# Patient Record
Sex: Female | Born: 1950
Health system: Southern US, Community
[De-identification: ages and names within clinical notes are randomized; demographics above are authoritative.]

## PROBLEM LIST (undated history)

## (undated) DIAGNOSIS — M1990 Unspecified osteoarthritis, unspecified site: Secondary | ICD-10-CM

## (undated) DIAGNOSIS — K589 Irritable bowel syndrome without diarrhea: Secondary | ICD-10-CM

## (undated) DIAGNOSIS — N3281 Overactive bladder: Secondary | ICD-10-CM

## (undated) DIAGNOSIS — Z8719 Personal history of other diseases of the digestive system: Secondary | ICD-10-CM

## (undated) DIAGNOSIS — F32A Depression, unspecified: Secondary | ICD-10-CM

## (undated) DIAGNOSIS — J45909 Unspecified asthma, uncomplicated: Secondary | ICD-10-CM

## (undated) DIAGNOSIS — E7800 Pure hypercholesterolemia, unspecified: Secondary | ICD-10-CM

## (undated) DIAGNOSIS — K219 Gastro-esophageal reflux disease without esophagitis: Secondary | ICD-10-CM

## (undated) HISTORY — PX: NASAL SINUS SURGERY: SHX719

## (undated) HISTORY — DX: Pure hypercholesterolemia, unspecified: E78.00

## (undated) HISTORY — DX: Gastro-esophageal reflux disease without esophagitis: K21.9

## (undated) HISTORY — PX: TONSILLECTOMY: SUR1361

## (undated) HISTORY — PX: APPENDECTOMY: SHX54

## (undated) HISTORY — DX: Overactive bladder: N32.81

## (undated) HISTORY — DX: Personal history of other diseases of the digestive system: Z87.19

## (undated) HISTORY — DX: Unspecified osteoarthritis, unspecified site: M19.90

## (undated) HISTORY — PX: CHOLECYSTECTOMY: SHX55

## (undated) HISTORY — DX: Irritable bowel syndrome, unspecified: K58.9

## (undated) HISTORY — PX: VAGINAL HYSTERECTOMY: SUR661

## (undated) HISTORY — DX: Depression, unspecified: F32.A

## (undated) HISTORY — DX: Unspecified asthma, uncomplicated: J45.909

---

## 1997-12-07 ENCOUNTER — Other Ambulatory Visit: Admission: RE | Admit: 1997-12-07 | Discharge: 1997-12-07 | Payer: Self-pay | Admitting: Obstetrics and Gynecology

## 1998-07-11 ENCOUNTER — Inpatient Hospital Stay (HOSPITAL_COMMUNITY): Admission: EM | Admit: 1998-07-11 | Discharge: 1998-07-12 | Payer: Self-pay | Admitting: *Deleted

## 1998-09-14 ENCOUNTER — Other Ambulatory Visit: Admission: RE | Admit: 1998-09-14 | Discharge: 1998-09-14 | Payer: Self-pay | Admitting: Gynecology

## 2011-06-23 HISTORY — PX: COLONOSCOPY: SHX174

## 2012-05-15 HISTORY — PX: BLADDER SURGERY: SHX569

## 2015-05-26 DIAGNOSIS — T148 Other injury of unspecified body region: Secondary | ICD-10-CM | POA: Diagnosis not present

## 2015-05-26 DIAGNOSIS — M2550 Pain in unspecified joint: Secondary | ICD-10-CM | POA: Diagnosis not present

## 2015-05-26 DIAGNOSIS — M79641 Pain in right hand: Secondary | ICD-10-CM | POA: Diagnosis not present

## 2015-07-26 DIAGNOSIS — E785 Hyperlipidemia, unspecified: Secondary | ICD-10-CM | POA: Diagnosis not present

## 2015-07-26 DIAGNOSIS — J45909 Unspecified asthma, uncomplicated: Secondary | ICD-10-CM | POA: Diagnosis not present

## 2015-07-26 DIAGNOSIS — J0110 Acute frontal sinusitis, unspecified: Secondary | ICD-10-CM | POA: Diagnosis not present

## 2015-07-26 DIAGNOSIS — R7301 Impaired fasting glucose: Secondary | ICD-10-CM | POA: Diagnosis not present

## 2015-09-21 DIAGNOSIS — R350 Frequency of micturition: Secondary | ICD-10-CM | POA: Diagnosis not present

## 2015-09-21 DIAGNOSIS — Z6824 Body mass index (BMI) 24.0-24.9, adult: Secondary | ICD-10-CM | POA: Diagnosis not present

## 2015-09-21 DIAGNOSIS — N390 Urinary tract infection, site not specified: Secondary | ICD-10-CM | POA: Diagnosis not present

## 2015-12-07 DIAGNOSIS — F339 Major depressive disorder, recurrent, unspecified: Secondary | ICD-10-CM | POA: Diagnosis not present

## 2015-12-07 DIAGNOSIS — Z7189 Other specified counseling: Secondary | ICD-10-CM | POA: Diagnosis not present

## 2015-12-07 DIAGNOSIS — E785 Hyperlipidemia, unspecified: Secondary | ICD-10-CM | POA: Diagnosis not present

## 2015-12-07 DIAGNOSIS — R7303 Prediabetes: Secondary | ICD-10-CM | POA: Diagnosis not present

## 2015-12-07 DIAGNOSIS — Z6824 Body mass index (BMI) 24.0-24.9, adult: Secondary | ICD-10-CM | POA: Diagnosis not present

## 2016-01-25 DIAGNOSIS — J0100 Acute maxillary sinusitis, unspecified: Secondary | ICD-10-CM | POA: Diagnosis not present

## 2016-01-25 DIAGNOSIS — Z6825 Body mass index (BMI) 25.0-25.9, adult: Secondary | ICD-10-CM | POA: Diagnosis not present

## 2016-01-25 DIAGNOSIS — J45909 Unspecified asthma, uncomplicated: Secondary | ICD-10-CM | POA: Diagnosis not present

## 2016-01-25 DIAGNOSIS — J0110 Acute frontal sinusitis, unspecified: Secondary | ICD-10-CM | POA: Diagnosis not present

## 2016-04-05 DIAGNOSIS — R7303 Prediabetes: Secondary | ICD-10-CM | POA: Diagnosis not present

## 2016-04-05 DIAGNOSIS — E785 Hyperlipidemia, unspecified: Secondary | ICD-10-CM | POA: Diagnosis not present

## 2016-04-11 DIAGNOSIS — K219 Gastro-esophageal reflux disease without esophagitis: Secondary | ICD-10-CM | POA: Diagnosis not present

## 2016-04-11 DIAGNOSIS — F339 Major depressive disorder, recurrent, unspecified: Secondary | ICD-10-CM | POA: Diagnosis not present

## 2016-04-11 DIAGNOSIS — E785 Hyperlipidemia, unspecified: Secondary | ICD-10-CM | POA: Diagnosis not present

## 2016-04-11 DIAGNOSIS — R1314 Dysphagia, pharyngoesophageal phase: Secondary | ICD-10-CM | POA: Diagnosis not present

## 2016-05-02 DIAGNOSIS — Z1231 Encounter for screening mammogram for malignant neoplasm of breast: Secondary | ICD-10-CM | POA: Diagnosis not present

## 2016-05-26 DIAGNOSIS — Z139 Encounter for screening, unspecified: Secondary | ICD-10-CM | POA: Diagnosis not present

## 2016-05-26 DIAGNOSIS — Z1389 Encounter for screening for other disorder: Secondary | ICD-10-CM | POA: Diagnosis not present

## 2016-05-26 DIAGNOSIS — Z0000 Encounter for general adult medical examination without abnormal findings: Secondary | ICD-10-CM | POA: Diagnosis not present

## 2016-05-26 DIAGNOSIS — F339 Major depressive disorder, recurrent, unspecified: Secondary | ICD-10-CM | POA: Diagnosis not present

## 2016-06-09 DIAGNOSIS — Z6824 Body mass index (BMI) 24.0-24.9, adult: Secondary | ICD-10-CM | POA: Diagnosis not present

## 2016-06-09 DIAGNOSIS — J101 Influenza due to other identified influenza virus with other respiratory manifestations: Secondary | ICD-10-CM | POA: Diagnosis not present

## 2016-08-09 DIAGNOSIS — R7303 Prediabetes: Secondary | ICD-10-CM | POA: Diagnosis not present

## 2016-08-09 DIAGNOSIS — E785 Hyperlipidemia, unspecified: Secondary | ICD-10-CM | POA: Diagnosis not present

## 2016-08-21 DIAGNOSIS — E785 Hyperlipidemia, unspecified: Secondary | ICD-10-CM | POA: Diagnosis not present

## 2016-08-21 DIAGNOSIS — Z6824 Body mass index (BMI) 24.0-24.9, adult: Secondary | ICD-10-CM | POA: Diagnosis not present

## 2016-08-21 DIAGNOSIS — K921 Melena: Secondary | ICD-10-CM | POA: Diagnosis not present

## 2016-08-21 DIAGNOSIS — R7303 Prediabetes: Secondary | ICD-10-CM | POA: Diagnosis not present

## 2016-08-24 DIAGNOSIS — R1032 Left lower quadrant pain: Secondary | ICD-10-CM | POA: Diagnosis not present

## 2016-08-24 DIAGNOSIS — K625 Hemorrhage of anus and rectum: Secondary | ICD-10-CM | POA: Diagnosis not present

## 2016-08-24 DIAGNOSIS — Z1211 Encounter for screening for malignant neoplasm of colon: Secondary | ICD-10-CM | POA: Diagnosis not present

## 2016-11-13 DIAGNOSIS — R7303 Prediabetes: Secondary | ICD-10-CM | POA: Diagnosis not present

## 2016-11-13 DIAGNOSIS — E785 Hyperlipidemia, unspecified: Secondary | ICD-10-CM | POA: Diagnosis not present

## 2016-11-20 DIAGNOSIS — J302 Other seasonal allergic rhinitis: Secondary | ICD-10-CM | POA: Diagnosis not present

## 2016-11-20 DIAGNOSIS — J305 Allergic rhinitis due to food: Secondary | ICD-10-CM | POA: Diagnosis not present

## 2016-11-20 DIAGNOSIS — F339 Major depressive disorder, recurrent, unspecified: Secondary | ICD-10-CM | POA: Diagnosis not present

## 2016-11-20 DIAGNOSIS — E785 Hyperlipidemia, unspecified: Secondary | ICD-10-CM | POA: Diagnosis not present

## 2016-11-20 DIAGNOSIS — J45909 Unspecified asthma, uncomplicated: Secondary | ICD-10-CM | POA: Diagnosis not present

## 2016-11-21 DIAGNOSIS — J305 Allergic rhinitis due to food: Secondary | ICD-10-CM | POA: Diagnosis not present

## 2016-11-21 DIAGNOSIS — J302 Other seasonal allergic rhinitis: Secondary | ICD-10-CM | POA: Diagnosis not present

## 2016-11-22 DIAGNOSIS — J302 Other seasonal allergic rhinitis: Secondary | ICD-10-CM | POA: Diagnosis not present

## 2016-11-22 DIAGNOSIS — J305 Allergic rhinitis due to food: Secondary | ICD-10-CM | POA: Diagnosis not present

## 2016-11-23 DIAGNOSIS — J302 Other seasonal allergic rhinitis: Secondary | ICD-10-CM | POA: Diagnosis not present

## 2016-11-23 DIAGNOSIS — J305 Allergic rhinitis due to food: Secondary | ICD-10-CM | POA: Diagnosis not present

## 2016-11-24 DIAGNOSIS — J305 Allergic rhinitis due to food: Secondary | ICD-10-CM | POA: Diagnosis not present

## 2016-11-24 DIAGNOSIS — J302 Other seasonal allergic rhinitis: Secondary | ICD-10-CM | POA: Diagnosis not present

## 2016-11-27 DIAGNOSIS — J305 Allergic rhinitis due to food: Secondary | ICD-10-CM | POA: Diagnosis not present

## 2016-11-27 DIAGNOSIS — J302 Other seasonal allergic rhinitis: Secondary | ICD-10-CM | POA: Diagnosis not present

## 2016-11-28 DIAGNOSIS — J302 Other seasonal allergic rhinitis: Secondary | ICD-10-CM | POA: Diagnosis not present

## 2016-11-28 DIAGNOSIS — J305 Allergic rhinitis due to food: Secondary | ICD-10-CM | POA: Diagnosis not present

## 2016-11-29 DIAGNOSIS — J305 Allergic rhinitis due to food: Secondary | ICD-10-CM | POA: Diagnosis not present

## 2016-11-29 DIAGNOSIS — J302 Other seasonal allergic rhinitis: Secondary | ICD-10-CM | POA: Diagnosis not present

## 2016-12-06 DIAGNOSIS — Z6823 Body mass index (BMI) 23.0-23.9, adult: Secondary | ICD-10-CM | POA: Diagnosis not present

## 2016-12-06 DIAGNOSIS — M79609 Pain in unspecified limb: Secondary | ICD-10-CM | POA: Diagnosis not present

## 2016-12-06 DIAGNOSIS — Z139 Encounter for screening, unspecified: Secondary | ICD-10-CM | POA: Diagnosis not present

## 2016-12-06 DIAGNOSIS — I10 Essential (primary) hypertension: Secondary | ICD-10-CM | POA: Diagnosis not present

## 2016-12-06 DIAGNOSIS — J45909 Unspecified asthma, uncomplicated: Secondary | ICD-10-CM | POA: Diagnosis not present

## 2017-01-02 DIAGNOSIS — H1045 Other chronic allergic conjunctivitis: Secondary | ICD-10-CM | POA: Diagnosis not present

## 2017-01-02 DIAGNOSIS — J302 Other seasonal allergic rhinitis: Secondary | ICD-10-CM | POA: Diagnosis not present

## 2017-01-02 DIAGNOSIS — S0501XA Injury of conjunctiva and corneal abrasion without foreign body, right eye, initial encounter: Secondary | ICD-10-CM | POA: Diagnosis not present

## 2017-02-12 DIAGNOSIS — Z23 Encounter for immunization: Secondary | ICD-10-CM | POA: Diagnosis not present

## 2017-02-21 DIAGNOSIS — Z6823 Body mass index (BMI) 23.0-23.9, adult: Secondary | ICD-10-CM | POA: Diagnosis not present

## 2017-02-21 DIAGNOSIS — J309 Allergic rhinitis, unspecified: Secondary | ICD-10-CM | POA: Diagnosis not present

## 2017-03-07 DIAGNOSIS — J3089 Other allergic rhinitis: Secondary | ICD-10-CM | POA: Diagnosis not present

## 2017-03-14 DIAGNOSIS — J3089 Other allergic rhinitis: Secondary | ICD-10-CM | POA: Diagnosis not present

## 2017-03-22 DIAGNOSIS — J3089 Other allergic rhinitis: Secondary | ICD-10-CM | POA: Diagnosis not present

## 2017-03-22 DIAGNOSIS — R7303 Prediabetes: Secondary | ICD-10-CM | POA: Diagnosis not present

## 2017-03-22 DIAGNOSIS — E785 Hyperlipidemia, unspecified: Secondary | ICD-10-CM | POA: Diagnosis not present

## 2017-03-28 DIAGNOSIS — J3089 Other allergic rhinitis: Secondary | ICD-10-CM | POA: Diagnosis not present

## 2017-03-29 DIAGNOSIS — R7303 Prediabetes: Secondary | ICD-10-CM | POA: Diagnosis not present

## 2017-03-29 DIAGNOSIS — Z6823 Body mass index (BMI) 23.0-23.9, adult: Secondary | ICD-10-CM | POA: Diagnosis not present

## 2017-03-29 DIAGNOSIS — Z139 Encounter for screening, unspecified: Secondary | ICD-10-CM | POA: Diagnosis not present

## 2017-03-29 DIAGNOSIS — E785 Hyperlipidemia, unspecified: Secondary | ICD-10-CM | POA: Diagnosis not present

## 2017-04-03 DIAGNOSIS — J3089 Other allergic rhinitis: Secondary | ICD-10-CM | POA: Diagnosis not present

## 2017-04-10 DIAGNOSIS — J3089 Other allergic rhinitis: Secondary | ICD-10-CM | POA: Diagnosis not present

## 2017-04-17 DIAGNOSIS — J3089 Other allergic rhinitis: Secondary | ICD-10-CM | POA: Diagnosis not present

## 2017-04-24 DIAGNOSIS — J3089 Other allergic rhinitis: Secondary | ICD-10-CM | POA: Diagnosis not present

## 2017-05-01 DIAGNOSIS — J3089 Other allergic rhinitis: Secondary | ICD-10-CM | POA: Diagnosis not present

## 2017-05-10 DIAGNOSIS — J3089 Other allergic rhinitis: Secondary | ICD-10-CM | POA: Diagnosis not present

## 2017-05-16 DIAGNOSIS — J3089 Other allergic rhinitis: Secondary | ICD-10-CM | POA: Diagnosis not present

## 2017-05-21 DIAGNOSIS — R05 Cough: Secondary | ICD-10-CM | POA: Diagnosis not present

## 2017-05-21 DIAGNOSIS — Z6823 Body mass index (BMI) 23.0-23.9, adult: Secondary | ICD-10-CM | POA: Diagnosis not present

## 2017-05-21 DIAGNOSIS — J0100 Acute maxillary sinusitis, unspecified: Secondary | ICD-10-CM | POA: Diagnosis not present

## 2017-05-21 DIAGNOSIS — J45909 Unspecified asthma, uncomplicated: Secondary | ICD-10-CM | POA: Diagnosis not present

## 2017-05-23 DIAGNOSIS — J3089 Other allergic rhinitis: Secondary | ICD-10-CM | POA: Diagnosis not present

## 2017-05-29 DIAGNOSIS — J3089 Other allergic rhinitis: Secondary | ICD-10-CM | POA: Diagnosis not present

## 2017-06-05 DIAGNOSIS — J3089 Other allergic rhinitis: Secondary | ICD-10-CM | POA: Diagnosis not present

## 2017-06-13 DIAGNOSIS — J3089 Other allergic rhinitis: Secondary | ICD-10-CM | POA: Diagnosis not present

## 2017-06-20 DIAGNOSIS — J3089 Other allergic rhinitis: Secondary | ICD-10-CM | POA: Diagnosis not present

## 2017-06-26 DIAGNOSIS — J3089 Other allergic rhinitis: Secondary | ICD-10-CM | POA: Diagnosis not present

## 2017-07-03 DIAGNOSIS — J3089 Other allergic rhinitis: Secondary | ICD-10-CM | POA: Diagnosis not present

## 2017-07-10 DIAGNOSIS — J3089 Other allergic rhinitis: Secondary | ICD-10-CM | POA: Diagnosis not present

## 2017-07-17 DIAGNOSIS — J3089 Other allergic rhinitis: Secondary | ICD-10-CM | POA: Diagnosis not present

## 2017-07-19 DIAGNOSIS — R7303 Prediabetes: Secondary | ICD-10-CM | POA: Diagnosis not present

## 2017-07-19 DIAGNOSIS — E785 Hyperlipidemia, unspecified: Secondary | ICD-10-CM | POA: Diagnosis not present

## 2017-07-25 DIAGNOSIS — J3089 Other allergic rhinitis: Secondary | ICD-10-CM | POA: Diagnosis not present

## 2017-07-26 DIAGNOSIS — Z789 Other specified health status: Secondary | ICD-10-CM | POA: Diagnosis not present

## 2017-07-26 DIAGNOSIS — R7303 Prediabetes: Secondary | ICD-10-CM | POA: Diagnosis not present

## 2017-07-26 DIAGNOSIS — E785 Hyperlipidemia, unspecified: Secondary | ICD-10-CM | POA: Diagnosis not present

## 2017-07-26 DIAGNOSIS — Z139 Encounter for screening, unspecified: Secondary | ICD-10-CM | POA: Diagnosis not present

## 2017-07-30 DIAGNOSIS — Z6822 Body mass index (BMI) 22.0-22.9, adult: Secondary | ICD-10-CM | POA: Diagnosis not present

## 2017-07-30 DIAGNOSIS — J101 Influenza due to other identified influenza virus with other respiratory manifestations: Secondary | ICD-10-CM | POA: Diagnosis not present

## 2017-08-08 DIAGNOSIS — Z0000 Encounter for general adult medical examination without abnormal findings: Secondary | ICD-10-CM | POA: Diagnosis not present

## 2017-08-08 DIAGNOSIS — F339 Major depressive disorder, recurrent, unspecified: Secondary | ICD-10-CM | POA: Diagnosis not present

## 2017-08-08 DIAGNOSIS — Z139 Encounter for screening, unspecified: Secondary | ICD-10-CM | POA: Diagnosis not present

## 2017-08-08 DIAGNOSIS — J3089 Other allergic rhinitis: Secondary | ICD-10-CM | POA: Diagnosis not present

## 2017-08-15 DIAGNOSIS — J3089 Other allergic rhinitis: Secondary | ICD-10-CM | POA: Diagnosis not present

## 2017-08-22 DIAGNOSIS — J3089 Other allergic rhinitis: Secondary | ICD-10-CM | POA: Diagnosis not present

## 2017-08-22 DIAGNOSIS — Z1231 Encounter for screening mammogram for malignant neoplasm of breast: Secondary | ICD-10-CM | POA: Diagnosis not present

## 2017-08-29 DIAGNOSIS — J3089 Other allergic rhinitis: Secondary | ICD-10-CM | POA: Diagnosis not present

## 2017-09-05 DIAGNOSIS — Z6822 Body mass index (BMI) 22.0-22.9, adult: Secondary | ICD-10-CM | POA: Diagnosis not present

## 2017-09-05 DIAGNOSIS — J029 Acute pharyngitis, unspecified: Secondary | ICD-10-CM | POA: Diagnosis not present

## 2017-09-12 DIAGNOSIS — J3089 Other allergic rhinitis: Secondary | ICD-10-CM | POA: Diagnosis not present

## 2017-09-19 DIAGNOSIS — J3089 Other allergic rhinitis: Secondary | ICD-10-CM | POA: Diagnosis not present

## 2017-09-25 DIAGNOSIS — J3089 Other allergic rhinitis: Secondary | ICD-10-CM | POA: Diagnosis not present

## 2017-09-26 DIAGNOSIS — M25571 Pain in right ankle and joints of right foot: Secondary | ICD-10-CM | POA: Diagnosis not present

## 2017-10-03 DIAGNOSIS — J3089 Other allergic rhinitis: Secondary | ICD-10-CM | POA: Diagnosis not present

## 2017-10-03 DIAGNOSIS — M898X7 Other specified disorders of bone, ankle and foot: Secondary | ICD-10-CM | POA: Diagnosis not present

## 2017-10-03 DIAGNOSIS — M79671 Pain in right foot: Secondary | ICD-10-CM | POA: Diagnosis not present

## 2017-10-03 DIAGNOSIS — M65871 Other synovitis and tenosynovitis, right ankle and foot: Secondary | ICD-10-CM | POA: Diagnosis not present

## 2017-10-09 DIAGNOSIS — M25571 Pain in right ankle and joints of right foot: Secondary | ICD-10-CM | POA: Diagnosis not present

## 2017-10-10 DIAGNOSIS — J3089 Other allergic rhinitis: Secondary | ICD-10-CM | POA: Diagnosis not present

## 2017-10-12 DIAGNOSIS — K921 Melena: Secondary | ICD-10-CM | POA: Diagnosis not present

## 2017-10-12 DIAGNOSIS — E785 Hyperlipidemia, unspecified: Secondary | ICD-10-CM | POA: Diagnosis not present

## 2017-10-29 DIAGNOSIS — M79671 Pain in right foot: Secondary | ICD-10-CM | POA: Diagnosis not present

## 2017-10-29 DIAGNOSIS — M25571 Pain in right ankle and joints of right foot: Secondary | ICD-10-CM | POA: Diagnosis not present

## 2017-10-29 DIAGNOSIS — M84371A Stress fracture, right ankle, initial encounter for fracture: Secondary | ICD-10-CM | POA: Diagnosis not present

## 2017-11-07 DIAGNOSIS — J3089 Other allergic rhinitis: Secondary | ICD-10-CM | POA: Diagnosis not present

## 2017-11-11 DIAGNOSIS — E785 Hyperlipidemia, unspecified: Secondary | ICD-10-CM | POA: Diagnosis not present

## 2017-11-19 DIAGNOSIS — M85671 Other cyst of bone, right ankle and foot: Secondary | ICD-10-CM | POA: Diagnosis not present

## 2017-11-19 DIAGNOSIS — M84371D Stress fracture, right ankle, subsequent encounter for fracture with routine healing: Secondary | ICD-10-CM | POA: Diagnosis not present

## 2017-11-28 DIAGNOSIS — R7303 Prediabetes: Secondary | ICD-10-CM | POA: Diagnosis not present

## 2017-11-28 DIAGNOSIS — E785 Hyperlipidemia, unspecified: Secondary | ICD-10-CM | POA: Diagnosis not present

## 2017-12-05 DIAGNOSIS — J3089 Other allergic rhinitis: Secondary | ICD-10-CM | POA: Diagnosis not present

## 2017-12-06 DIAGNOSIS — E785 Hyperlipidemia, unspecified: Secondary | ICD-10-CM | POA: Diagnosis not present

## 2017-12-06 DIAGNOSIS — F339 Major depressive disorder, recurrent, unspecified: Secondary | ICD-10-CM | POA: Diagnosis not present

## 2017-12-06 DIAGNOSIS — Z7189 Other specified counseling: Secondary | ICD-10-CM | POA: Diagnosis not present

## 2017-12-06 DIAGNOSIS — Z139 Encounter for screening, unspecified: Secondary | ICD-10-CM | POA: Diagnosis not present

## 2017-12-06 DIAGNOSIS — R7303 Prediabetes: Secondary | ICD-10-CM | POA: Diagnosis not present

## 2017-12-12 DIAGNOSIS — E785 Hyperlipidemia, unspecified: Secondary | ICD-10-CM | POA: Diagnosis not present

## 2017-12-12 DIAGNOSIS — J45909 Unspecified asthma, uncomplicated: Secondary | ICD-10-CM | POA: Diagnosis not present

## 2017-12-31 DIAGNOSIS — M85671 Other cyst of bone, right ankle and foot: Secondary | ICD-10-CM | POA: Diagnosis not present

## 2018-01-01 DIAGNOSIS — K589 Irritable bowel syndrome without diarrhea: Secondary | ICD-10-CM | POA: Diagnosis not present

## 2018-01-01 DIAGNOSIS — K219 Gastro-esophageal reflux disease without esophagitis: Secondary | ICD-10-CM | POA: Diagnosis not present

## 2018-01-01 DIAGNOSIS — M84371D Stress fracture, right ankle, subsequent encounter for fracture with routine healing: Secondary | ICD-10-CM | POA: Diagnosis not present

## 2018-01-01 DIAGNOSIS — M85671 Other cyst of bone, right ankle and foot: Secondary | ICD-10-CM | POA: Diagnosis not present

## 2018-01-01 DIAGNOSIS — J45909 Unspecified asthma, uncomplicated: Secondary | ICD-10-CM | POA: Diagnosis not present

## 2018-01-01 DIAGNOSIS — F329 Major depressive disorder, single episode, unspecified: Secondary | ICD-10-CM | POA: Diagnosis not present

## 2018-01-01 DIAGNOSIS — Z79899 Other long term (current) drug therapy: Secondary | ICD-10-CM | POA: Diagnosis not present

## 2018-01-09 DIAGNOSIS — J3089 Other allergic rhinitis: Secondary | ICD-10-CM | POA: Diagnosis not present

## 2018-01-11 DIAGNOSIS — E785 Hyperlipidemia, unspecified: Secondary | ICD-10-CM | POA: Diagnosis not present

## 2018-01-11 DIAGNOSIS — J45909 Unspecified asthma, uncomplicated: Secondary | ICD-10-CM | POA: Diagnosis not present

## 2018-02-07 DIAGNOSIS — J3089 Other allergic rhinitis: Secondary | ICD-10-CM | POA: Diagnosis not present

## 2018-02-11 DIAGNOSIS — R7303 Prediabetes: Secondary | ICD-10-CM | POA: Diagnosis not present

## 2018-02-11 DIAGNOSIS — J45909 Unspecified asthma, uncomplicated: Secondary | ICD-10-CM | POA: Diagnosis not present

## 2018-02-11 DIAGNOSIS — E785 Hyperlipidemia, unspecified: Secondary | ICD-10-CM | POA: Diagnosis not present

## 2018-02-19 DIAGNOSIS — Z23 Encounter for immunization: Secondary | ICD-10-CM | POA: Diagnosis not present

## 2018-03-07 DIAGNOSIS — J3089 Other allergic rhinitis: Secondary | ICD-10-CM | POA: Diagnosis not present

## 2018-03-11 DIAGNOSIS — M19071 Primary osteoarthritis, right ankle and foot: Secondary | ICD-10-CM | POA: Diagnosis not present

## 2018-03-14 DIAGNOSIS — E785 Hyperlipidemia, unspecified: Secondary | ICD-10-CM | POA: Diagnosis not present

## 2018-03-14 DIAGNOSIS — J45909 Unspecified asthma, uncomplicated: Secondary | ICD-10-CM | POA: Diagnosis not present

## 2018-03-14 DIAGNOSIS — R7303 Prediabetes: Secondary | ICD-10-CM | POA: Diagnosis not present

## 2018-04-03 ENCOUNTER — Other Ambulatory Visit: Payer: Self-pay

## 2018-04-03 DIAGNOSIS — Z139 Encounter for screening, unspecified: Secondary | ICD-10-CM | POA: Diagnosis not present

## 2018-04-03 DIAGNOSIS — E785 Hyperlipidemia, unspecified: Secondary | ICD-10-CM | POA: Diagnosis not present

## 2018-04-03 DIAGNOSIS — R7303 Prediabetes: Secondary | ICD-10-CM | POA: Diagnosis not present

## 2018-05-05 DIAGNOSIS — J45901 Unspecified asthma with (acute) exacerbation: Secondary | ICD-10-CM | POA: Diagnosis not present

## 2018-05-13 DIAGNOSIS — Z6823 Body mass index (BMI) 23.0-23.9, adult: Secondary | ICD-10-CM | POA: Diagnosis not present

## 2018-05-13 DIAGNOSIS — J101 Influenza due to other identified influenza virus with other respiratory manifestations: Secondary | ICD-10-CM | POA: Diagnosis not present

## 2018-05-14 DIAGNOSIS — R7303 Prediabetes: Secondary | ICD-10-CM | POA: Diagnosis not present

## 2018-05-14 DIAGNOSIS — E785 Hyperlipidemia, unspecified: Secondary | ICD-10-CM | POA: Diagnosis not present

## 2018-05-29 DIAGNOSIS — Z6823 Body mass index (BMI) 23.0-23.9, adult: Secondary | ICD-10-CM | POA: Diagnosis not present

## 2018-05-29 DIAGNOSIS — R05 Cough: Secondary | ICD-10-CM | POA: Diagnosis not present

## 2018-06-14 DIAGNOSIS — R7303 Prediabetes: Secondary | ICD-10-CM | POA: Diagnosis not present

## 2018-06-14 DIAGNOSIS — R05 Cough: Secondary | ICD-10-CM | POA: Diagnosis not present

## 2018-06-14 DIAGNOSIS — E785 Hyperlipidemia, unspecified: Secondary | ICD-10-CM | POA: Diagnosis not present

## 2018-06-19 DIAGNOSIS — R7303 Prediabetes: Secondary | ICD-10-CM | POA: Diagnosis not present

## 2018-08-13 DIAGNOSIS — E785 Hyperlipidemia, unspecified: Secondary | ICD-10-CM | POA: Diagnosis not present

## 2018-08-13 DIAGNOSIS — R7303 Prediabetes: Secondary | ICD-10-CM | POA: Diagnosis not present

## 2018-08-16 DIAGNOSIS — E785 Hyperlipidemia, unspecified: Secondary | ICD-10-CM | POA: Diagnosis not present

## 2018-08-16 DIAGNOSIS — R7303 Prediabetes: Secondary | ICD-10-CM | POA: Diagnosis not present

## 2018-08-23 DIAGNOSIS — R7303 Prediabetes: Secondary | ICD-10-CM | POA: Diagnosis not present

## 2018-08-23 DIAGNOSIS — F339 Major depressive disorder, recurrent, unspecified: Secondary | ICD-10-CM | POA: Diagnosis not present

## 2018-08-23 DIAGNOSIS — J3089 Other allergic rhinitis: Secondary | ICD-10-CM | POA: Diagnosis not present

## 2018-08-23 DIAGNOSIS — J45909 Unspecified asthma, uncomplicated: Secondary | ICD-10-CM | POA: Diagnosis not present

## 2018-09-09 DIAGNOSIS — Z1389 Encounter for screening for other disorder: Secondary | ICD-10-CM | POA: Diagnosis not present

## 2018-09-09 DIAGNOSIS — Z1331 Encounter for screening for depression: Secondary | ICD-10-CM | POA: Diagnosis not present

## 2018-09-09 DIAGNOSIS — Z0000 Encounter for general adult medical examination without abnormal findings: Secondary | ICD-10-CM | POA: Diagnosis not present

## 2018-09-12 DIAGNOSIS — R7303 Prediabetes: Secondary | ICD-10-CM | POA: Diagnosis not present

## 2018-09-12 DIAGNOSIS — E785 Hyperlipidemia, unspecified: Secondary | ICD-10-CM | POA: Diagnosis not present

## 2018-09-12 DIAGNOSIS — F339 Major depressive disorder, recurrent, unspecified: Secondary | ICD-10-CM | POA: Diagnosis not present

## 2018-10-11 DIAGNOSIS — J45909 Unspecified asthma, uncomplicated: Secondary | ICD-10-CM | POA: Diagnosis not present

## 2018-10-11 DIAGNOSIS — F339 Major depressive disorder, recurrent, unspecified: Secondary | ICD-10-CM | POA: Diagnosis not present

## 2018-10-11 DIAGNOSIS — E785 Hyperlipidemia, unspecified: Secondary | ICD-10-CM | POA: Diagnosis not present

## 2018-10-11 DIAGNOSIS — R7303 Prediabetes: Secondary | ICD-10-CM | POA: Diagnosis not present

## 2018-10-17 DIAGNOSIS — L821 Other seborrheic keratosis: Secondary | ICD-10-CM | POA: Diagnosis not present

## 2018-10-17 DIAGNOSIS — D2239 Melanocytic nevi of other parts of face: Secondary | ICD-10-CM | POA: Diagnosis not present

## 2018-10-17 DIAGNOSIS — Z85820 Personal history of malignant melanoma of skin: Secondary | ICD-10-CM | POA: Diagnosis not present

## 2018-10-17 DIAGNOSIS — D225 Melanocytic nevi of trunk: Secondary | ICD-10-CM | POA: Diagnosis not present

## 2018-11-12 DIAGNOSIS — E785 Hyperlipidemia, unspecified: Secondary | ICD-10-CM | POA: Diagnosis not present

## 2018-11-12 DIAGNOSIS — R7303 Prediabetes: Secondary | ICD-10-CM | POA: Diagnosis not present

## 2018-11-12 DIAGNOSIS — J45909 Unspecified asthma, uncomplicated: Secondary | ICD-10-CM | POA: Diagnosis not present

## 2018-11-12 DIAGNOSIS — F339 Major depressive disorder, recurrent, unspecified: Secondary | ICD-10-CM | POA: Diagnosis not present

## 2018-12-09 DIAGNOSIS — Z1231 Encounter for screening mammogram for malignant neoplasm of breast: Secondary | ICD-10-CM | POA: Diagnosis not present

## 2018-12-09 DIAGNOSIS — N959 Unspecified menopausal and perimenopausal disorder: Secondary | ICD-10-CM | POA: Diagnosis not present

## 2018-12-09 DIAGNOSIS — M8589 Other specified disorders of bone density and structure, multiple sites: Secondary | ICD-10-CM | POA: Diagnosis not present

## 2018-12-13 DIAGNOSIS — E785 Hyperlipidemia, unspecified: Secondary | ICD-10-CM | POA: Diagnosis not present

## 2018-12-13 DIAGNOSIS — F339 Major depressive disorder, recurrent, unspecified: Secondary | ICD-10-CM | POA: Diagnosis not present

## 2018-12-13 DIAGNOSIS — J45909 Unspecified asthma, uncomplicated: Secondary | ICD-10-CM | POA: Diagnosis not present

## 2018-12-13 DIAGNOSIS — R7303 Prediabetes: Secondary | ICD-10-CM | POA: Diagnosis not present

## 2019-01-01 DIAGNOSIS — R7303 Prediabetes: Secondary | ICD-10-CM | POA: Diagnosis not present

## 2019-01-06 DIAGNOSIS — F322 Major depressive disorder, single episode, severe without psychotic features: Secondary | ICD-10-CM | POA: Diagnosis not present

## 2019-01-06 DIAGNOSIS — Z6821 Body mass index (BMI) 21.0-21.9, adult: Secondary | ICD-10-CM | POA: Diagnosis not present

## 2019-01-06 DIAGNOSIS — E785 Hyperlipidemia, unspecified: Secondary | ICD-10-CM | POA: Diagnosis not present

## 2019-01-06 DIAGNOSIS — R7303 Prediabetes: Secondary | ICD-10-CM | POA: Diagnosis not present

## 2019-01-08 DIAGNOSIS — R7303 Prediabetes: Secondary | ICD-10-CM | POA: Diagnosis not present

## 2019-01-13 DIAGNOSIS — E785 Hyperlipidemia, unspecified: Secondary | ICD-10-CM | POA: Diagnosis not present

## 2019-01-13 DIAGNOSIS — M8580 Other specified disorders of bone density and structure, unspecified site: Secondary | ICD-10-CM | POA: Diagnosis not present

## 2019-01-13 DIAGNOSIS — Z139 Encounter for screening, unspecified: Secondary | ICD-10-CM | POA: Diagnosis not present

## 2019-01-13 DIAGNOSIS — J45909 Unspecified asthma, uncomplicated: Secondary | ICD-10-CM | POA: Diagnosis not present

## 2019-01-13 DIAGNOSIS — F322 Major depressive disorder, single episode, severe without psychotic features: Secondary | ICD-10-CM | POA: Diagnosis not present

## 2019-01-13 DIAGNOSIS — R7303 Prediabetes: Secondary | ICD-10-CM | POA: Diagnosis not present

## 2019-01-13 DIAGNOSIS — F339 Major depressive disorder, recurrent, unspecified: Secondary | ICD-10-CM | POA: Diagnosis not present

## 2019-01-15 DIAGNOSIS — E785 Hyperlipidemia, unspecified: Secondary | ICD-10-CM | POA: Diagnosis not present

## 2019-01-15 DIAGNOSIS — R7303 Prediabetes: Secondary | ICD-10-CM | POA: Diagnosis not present

## 2019-01-22 DIAGNOSIS — E785 Hyperlipidemia, unspecified: Secondary | ICD-10-CM | POA: Diagnosis not present

## 2019-01-22 DIAGNOSIS — M8580 Other specified disorders of bone density and structure, unspecified site: Secondary | ICD-10-CM | POA: Diagnosis not present

## 2019-01-22 DIAGNOSIS — R7303 Prediabetes: Secondary | ICD-10-CM | POA: Diagnosis not present

## 2019-01-29 DIAGNOSIS — E785 Hyperlipidemia, unspecified: Secondary | ICD-10-CM | POA: Diagnosis not present

## 2019-01-29 DIAGNOSIS — F322 Major depressive disorder, single episode, severe without psychotic features: Secondary | ICD-10-CM | POA: Diagnosis not present

## 2019-01-29 DIAGNOSIS — R7303 Prediabetes: Secondary | ICD-10-CM | POA: Diagnosis not present

## 2019-02-05 DIAGNOSIS — R7303 Prediabetes: Secondary | ICD-10-CM | POA: Diagnosis not present

## 2019-02-12 DIAGNOSIS — E785 Hyperlipidemia, unspecified: Secondary | ICD-10-CM | POA: Diagnosis not present

## 2019-02-12 DIAGNOSIS — F322 Major depressive disorder, single episode, severe without psychotic features: Secondary | ICD-10-CM | POA: Diagnosis not present

## 2019-02-12 DIAGNOSIS — M8580 Other specified disorders of bone density and structure, unspecified site: Secondary | ICD-10-CM | POA: Diagnosis not present

## 2019-02-12 DIAGNOSIS — R7303 Prediabetes: Secondary | ICD-10-CM | POA: Diagnosis not present

## 2019-02-13 DIAGNOSIS — R251 Tremor, unspecified: Secondary | ICD-10-CM | POA: Diagnosis not present

## 2019-02-13 DIAGNOSIS — F322 Major depressive disorder, single episode, severe without psychotic features: Secondary | ICD-10-CM | POA: Diagnosis not present

## 2019-02-13 DIAGNOSIS — Z6821 Body mass index (BMI) 21.0-21.9, adult: Secondary | ICD-10-CM | POA: Diagnosis not present

## 2019-02-13 DIAGNOSIS — Z23 Encounter for immunization: Secondary | ICD-10-CM | POA: Diagnosis not present

## 2019-02-19 DIAGNOSIS — R7303 Prediabetes: Secondary | ICD-10-CM | POA: Diagnosis not present

## 2019-03-05 DIAGNOSIS — E785 Hyperlipidemia, unspecified: Secondary | ICD-10-CM | POA: Diagnosis not present

## 2019-03-05 DIAGNOSIS — R7303 Prediabetes: Secondary | ICD-10-CM | POA: Diagnosis not present

## 2019-03-14 DIAGNOSIS — R7303 Prediabetes: Secondary | ICD-10-CM | POA: Diagnosis not present

## 2019-03-14 DIAGNOSIS — F322 Major depressive disorder, single episode, severe without psychotic features: Secondary | ICD-10-CM | POA: Diagnosis not present

## 2019-03-14 DIAGNOSIS — E785 Hyperlipidemia, unspecified: Secondary | ICD-10-CM | POA: Diagnosis not present

## 2019-03-17 DIAGNOSIS — Z681 Body mass index (BMI) 19 or less, adult: Secondary | ICD-10-CM | POA: Diagnosis not present

## 2019-03-17 DIAGNOSIS — F322 Major depressive disorder, single episode, severe without psychotic features: Secondary | ICD-10-CM | POA: Diagnosis not present

## 2019-03-20 DIAGNOSIS — Z139 Encounter for screening, unspecified: Secondary | ICD-10-CM | POA: Diagnosis not present

## 2019-03-20 DIAGNOSIS — F322 Major depressive disorder, single episode, severe without psychotic features: Secondary | ICD-10-CM | POA: Diagnosis not present

## 2019-03-20 DIAGNOSIS — R251 Tremor, unspecified: Secondary | ICD-10-CM | POA: Diagnosis not present

## 2019-03-20 DIAGNOSIS — R7303 Prediabetes: Secondary | ICD-10-CM | POA: Diagnosis not present

## 2019-04-04 DIAGNOSIS — F322 Major depressive disorder, single episode, severe without psychotic features: Secondary | ICD-10-CM | POA: Diagnosis not present

## 2019-04-04 DIAGNOSIS — Z6820 Body mass index (BMI) 20.0-20.9, adult: Secondary | ICD-10-CM | POA: Diagnosis not present

## 2019-04-14 DIAGNOSIS — R7303 Prediabetes: Secondary | ICD-10-CM | POA: Diagnosis not present

## 2019-04-14 DIAGNOSIS — E785 Hyperlipidemia, unspecified: Secondary | ICD-10-CM | POA: Diagnosis not present

## 2019-04-14 DIAGNOSIS — F322 Major depressive disorder, single episode, severe without psychotic features: Secondary | ICD-10-CM | POA: Diagnosis not present

## 2019-04-16 DIAGNOSIS — Z136 Encounter for screening for cardiovascular disorders: Secondary | ICD-10-CM | POA: Diagnosis not present

## 2019-06-04 DIAGNOSIS — R7303 Prediabetes: Secondary | ICD-10-CM | POA: Diagnosis not present

## 2019-06-05 DIAGNOSIS — H18523 Epithelial (juvenile) corneal dystrophy, bilateral: Secondary | ICD-10-CM | POA: Diagnosis not present

## 2019-06-05 DIAGNOSIS — H2513 Age-related nuclear cataract, bilateral: Secondary | ICD-10-CM | POA: Diagnosis not present

## 2019-06-05 DIAGNOSIS — H35363 Drusen (degenerative) of macula, bilateral: Secondary | ICD-10-CM | POA: Diagnosis not present

## 2019-06-05 DIAGNOSIS — R739 Hyperglycemia, unspecified: Secondary | ICD-10-CM | POA: Diagnosis not present

## 2019-06-13 DIAGNOSIS — E785 Hyperlipidemia, unspecified: Secondary | ICD-10-CM | POA: Diagnosis not present

## 2019-06-13 DIAGNOSIS — F322 Major depressive disorder, single episode, severe without psychotic features: Secondary | ICD-10-CM | POA: Diagnosis not present

## 2019-06-13 DIAGNOSIS — R7303 Prediabetes: Secondary | ICD-10-CM | POA: Diagnosis not present

## 2019-06-23 DIAGNOSIS — Z23 Encounter for immunization: Secondary | ICD-10-CM | POA: Diagnosis not present

## 2019-07-02 DIAGNOSIS — R7303 Prediabetes: Secondary | ICD-10-CM | POA: Diagnosis not present

## 2019-07-13 DIAGNOSIS — F322 Major depressive disorder, single episode, severe without psychotic features: Secondary | ICD-10-CM | POA: Diagnosis not present

## 2019-07-13 DIAGNOSIS — R7303 Prediabetes: Secondary | ICD-10-CM | POA: Diagnosis not present

## 2019-07-13 DIAGNOSIS — E785 Hyperlipidemia, unspecified: Secondary | ICD-10-CM | POA: Diagnosis not present

## 2019-07-17 DIAGNOSIS — R7303 Prediabetes: Secondary | ICD-10-CM | POA: Diagnosis not present

## 2019-07-17 DIAGNOSIS — E785 Hyperlipidemia, unspecified: Secondary | ICD-10-CM | POA: Diagnosis not present

## 2019-07-21 DIAGNOSIS — Z23 Encounter for immunization: Secondary | ICD-10-CM | POA: Diagnosis not present

## 2019-07-25 DIAGNOSIS — E785 Hyperlipidemia, unspecified: Secondary | ICD-10-CM | POA: Diagnosis not present

## 2019-07-25 DIAGNOSIS — F322 Major depressive disorder, single episode, severe without psychotic features: Secondary | ICD-10-CM | POA: Diagnosis not present

## 2019-07-25 DIAGNOSIS — R7303 Prediabetes: Secondary | ICD-10-CM | POA: Diagnosis not present

## 2019-07-25 DIAGNOSIS — Z6820 Body mass index (BMI) 20.0-20.9, adult: Secondary | ICD-10-CM | POA: Diagnosis not present

## 2019-07-30 DIAGNOSIS — R7303 Prediabetes: Secondary | ICD-10-CM | POA: Diagnosis not present

## 2019-08-08 DIAGNOSIS — Z01818 Encounter for other preprocedural examination: Secondary | ICD-10-CM | POA: Diagnosis not present

## 2019-08-08 DIAGNOSIS — H2511 Age-related nuclear cataract, right eye: Secondary | ICD-10-CM | POA: Diagnosis not present

## 2019-08-13 DIAGNOSIS — E785 Hyperlipidemia, unspecified: Secondary | ICD-10-CM | POA: Diagnosis not present

## 2019-08-13 DIAGNOSIS — R7303 Prediabetes: Secondary | ICD-10-CM | POA: Diagnosis not present

## 2019-08-13 DIAGNOSIS — F322 Major depressive disorder, single episode, severe without psychotic features: Secondary | ICD-10-CM | POA: Diagnosis not present

## 2019-08-16 DIAGNOSIS — R0789 Other chest pain: Secondary | ICD-10-CM | POA: Diagnosis not present

## 2019-08-16 DIAGNOSIS — R079 Chest pain, unspecified: Secondary | ICD-10-CM

## 2019-08-16 DIAGNOSIS — I10 Essential (primary) hypertension: Secondary | ICD-10-CM

## 2019-08-18 DIAGNOSIS — E7800 Pure hypercholesterolemia, unspecified: Secondary | ICD-10-CM | POA: Diagnosis present

## 2019-08-18 DIAGNOSIS — J45909 Unspecified asthma, uncomplicated: Secondary | ICD-10-CM | POA: Diagnosis not present

## 2019-08-18 DIAGNOSIS — F418 Other specified anxiety disorders: Secondary | ICD-10-CM | POA: Diagnosis not present

## 2019-08-18 DIAGNOSIS — Z79899 Other long term (current) drug therapy: Secondary | ICD-10-CM | POA: Diagnosis not present

## 2019-08-18 DIAGNOSIS — R079 Chest pain, unspecified: Secondary | ICD-10-CM | POA: Diagnosis not present

## 2019-08-18 DIAGNOSIS — I10 Essential (primary) hypertension: Secondary | ICD-10-CM | POA: Diagnosis not present

## 2019-08-21 DIAGNOSIS — F322 Major depressive disorder, single episode, severe without psychotic features: Secondary | ICD-10-CM | POA: Diagnosis not present

## 2019-08-21 DIAGNOSIS — R079 Chest pain, unspecified: Secondary | ICD-10-CM | POA: Diagnosis not present

## 2019-08-21 DIAGNOSIS — Z6820 Body mass index (BMI) 20.0-20.9, adult: Secondary | ICD-10-CM | POA: Diagnosis not present

## 2019-08-26 DIAGNOSIS — Z7982 Long term (current) use of aspirin: Secondary | ICD-10-CM | POA: Diagnosis not present

## 2019-08-26 DIAGNOSIS — Z79899 Other long term (current) drug therapy: Secondary | ICD-10-CM | POA: Diagnosis not present

## 2019-08-26 DIAGNOSIS — J45909 Unspecified asthma, uncomplicated: Secondary | ICD-10-CM | POA: Diagnosis not present

## 2019-08-26 DIAGNOSIS — E785 Hyperlipidemia, unspecified: Secondary | ICD-10-CM | POA: Diagnosis not present

## 2019-08-26 DIAGNOSIS — H2511 Age-related nuclear cataract, right eye: Secondary | ICD-10-CM | POA: Diagnosis not present

## 2019-08-26 DIAGNOSIS — H52223 Regular astigmatism, bilateral: Secondary | ICD-10-CM | POA: Diagnosis not present

## 2019-08-26 DIAGNOSIS — H259 Unspecified age-related cataract: Secondary | ICD-10-CM | POA: Diagnosis not present

## 2019-08-26 DIAGNOSIS — K219 Gastro-esophageal reflux disease without esophagitis: Secondary | ICD-10-CM | POA: Diagnosis not present

## 2019-08-27 DIAGNOSIS — R7303 Prediabetes: Secondary | ICD-10-CM | POA: Diagnosis not present

## 2019-09-12 DIAGNOSIS — E785 Hyperlipidemia, unspecified: Secondary | ICD-10-CM | POA: Diagnosis not present

## 2019-09-12 DIAGNOSIS — F322 Major depressive disorder, single episode, severe without psychotic features: Secondary | ICD-10-CM | POA: Diagnosis not present

## 2019-09-12 DIAGNOSIS — R7303 Prediabetes: Secondary | ICD-10-CM | POA: Diagnosis not present

## 2019-09-23 DIAGNOSIS — Z79899 Other long term (current) drug therapy: Secondary | ICD-10-CM | POA: Diagnosis not present

## 2019-09-23 DIAGNOSIS — E785 Hyperlipidemia, unspecified: Secondary | ICD-10-CM | POA: Diagnosis not present

## 2019-09-23 DIAGNOSIS — H2512 Age-related nuclear cataract, left eye: Secondary | ICD-10-CM | POA: Diagnosis not present

## 2019-09-23 DIAGNOSIS — K219 Gastro-esophageal reflux disease without esophagitis: Secondary | ICD-10-CM | POA: Diagnosis not present

## 2019-09-23 DIAGNOSIS — Z7952 Long term (current) use of systemic steroids: Secondary | ICD-10-CM | POA: Diagnosis not present

## 2019-09-23 DIAGNOSIS — H259 Unspecified age-related cataract: Secondary | ICD-10-CM | POA: Diagnosis not present

## 2019-09-23 DIAGNOSIS — J45909 Unspecified asthma, uncomplicated: Secondary | ICD-10-CM | POA: Diagnosis not present

## 2019-09-24 DIAGNOSIS — Z1339 Encounter for screening examination for other mental health and behavioral disorders: Secondary | ICD-10-CM | POA: Diagnosis not present

## 2019-09-24 DIAGNOSIS — Z7189 Other specified counseling: Secondary | ICD-10-CM | POA: Diagnosis not present

## 2019-09-24 DIAGNOSIS — Z0000 Encounter for general adult medical examination without abnormal findings: Secondary | ICD-10-CM | POA: Diagnosis not present

## 2019-09-24 DIAGNOSIS — Z139 Encounter for screening, unspecified: Secondary | ICD-10-CM | POA: Diagnosis not present

## 2019-09-24 DIAGNOSIS — Z136 Encounter for screening for cardiovascular disorders: Secondary | ICD-10-CM | POA: Diagnosis not present

## 2019-09-24 DIAGNOSIS — Z1331 Encounter for screening for depression: Secondary | ICD-10-CM | POA: Diagnosis not present

## 2019-09-25 DIAGNOSIS — H35363 Drusen (degenerative) of macula, bilateral: Secondary | ICD-10-CM | POA: Diagnosis not present

## 2019-09-25 DIAGNOSIS — R739 Hyperglycemia, unspecified: Secondary | ICD-10-CM | POA: Diagnosis not present

## 2019-09-25 DIAGNOSIS — H18523 Epithelial (juvenile) corneal dystrophy, bilateral: Secondary | ICD-10-CM | POA: Diagnosis not present

## 2019-09-25 DIAGNOSIS — H2513 Age-related nuclear cataract, bilateral: Secondary | ICD-10-CM | POA: Diagnosis not present

## 2019-10-13 DIAGNOSIS — E785 Hyperlipidemia, unspecified: Secondary | ICD-10-CM | POA: Diagnosis not present

## 2019-10-13 DIAGNOSIS — R7303 Prediabetes: Secondary | ICD-10-CM | POA: Diagnosis not present

## 2019-10-13 DIAGNOSIS — F322 Major depressive disorder, single episode, severe without psychotic features: Secondary | ICD-10-CM | POA: Diagnosis not present

## 2019-12-01 DIAGNOSIS — F322 Major depressive disorder, single episode, severe without psychotic features: Secondary | ICD-10-CM | POA: Diagnosis not present

## 2019-12-01 DIAGNOSIS — R7303 Prediabetes: Secondary | ICD-10-CM | POA: Diagnosis not present

## 2019-12-01 DIAGNOSIS — J45909 Unspecified asthma, uncomplicated: Secondary | ICD-10-CM | POA: Diagnosis not present

## 2019-12-01 DIAGNOSIS — E785 Hyperlipidemia, unspecified: Secondary | ICD-10-CM | POA: Diagnosis not present

## 2019-12-11 DIAGNOSIS — Z1231 Encounter for screening mammogram for malignant neoplasm of breast: Secondary | ICD-10-CM | POA: Diagnosis not present

## 2019-12-14 DIAGNOSIS — R7303 Prediabetes: Secondary | ICD-10-CM | POA: Diagnosis not present

## 2019-12-14 DIAGNOSIS — F322 Major depressive disorder, single episode, severe without psychotic features: Secondary | ICD-10-CM | POA: Diagnosis not present

## 2019-12-14 DIAGNOSIS — E785 Hyperlipidemia, unspecified: Secondary | ICD-10-CM | POA: Diagnosis not present

## 2019-12-14 DIAGNOSIS — J45909 Unspecified asthma, uncomplicated: Secondary | ICD-10-CM | POA: Diagnosis not present

## 2020-01-02 DIAGNOSIS — F419 Anxiety disorder, unspecified: Secondary | ICD-10-CM | POA: Diagnosis not present

## 2020-01-02 DIAGNOSIS — R030 Elevated blood-pressure reading, without diagnosis of hypertension: Secondary | ICD-10-CM | POA: Diagnosis not present

## 2020-01-02 DIAGNOSIS — Z6820 Body mass index (BMI) 20.0-20.9, adult: Secondary | ICD-10-CM | POA: Diagnosis not present

## 2020-01-12 DIAGNOSIS — Z6820 Body mass index (BMI) 20.0-20.9, adult: Secondary | ICD-10-CM | POA: Diagnosis not present

## 2020-01-12 DIAGNOSIS — R030 Elevated blood-pressure reading, without diagnosis of hypertension: Secondary | ICD-10-CM | POA: Diagnosis not present

## 2020-01-12 DIAGNOSIS — F322 Major depressive disorder, single episode, severe without psychotic features: Secondary | ICD-10-CM | POA: Diagnosis not present

## 2020-01-13 DIAGNOSIS — R7303 Prediabetes: Secondary | ICD-10-CM | POA: Diagnosis not present

## 2020-01-13 DIAGNOSIS — E785 Hyperlipidemia, unspecified: Secondary | ICD-10-CM | POA: Diagnosis not present

## 2020-01-13 DIAGNOSIS — F322 Major depressive disorder, single episode, severe without psychotic features: Secondary | ICD-10-CM | POA: Diagnosis not present

## 2020-01-28 DIAGNOSIS — Z20822 Contact with and (suspected) exposure to covid-19: Secondary | ICD-10-CM | POA: Diagnosis not present

## 2020-01-28 DIAGNOSIS — J029 Acute pharyngitis, unspecified: Secondary | ICD-10-CM | POA: Diagnosis not present

## 2020-01-28 DIAGNOSIS — Z6820 Body mass index (BMI) 20.0-20.9, adult: Secondary | ICD-10-CM | POA: Diagnosis not present

## 2020-02-13 DIAGNOSIS — J309 Allergic rhinitis, unspecified: Secondary | ICD-10-CM | POA: Diagnosis not present

## 2020-02-13 DIAGNOSIS — H6123 Impacted cerumen, bilateral: Secondary | ICD-10-CM | POA: Diagnosis not present

## 2020-02-13 DIAGNOSIS — F322 Major depressive disorder, single episode, severe without psychotic features: Secondary | ICD-10-CM | POA: Diagnosis not present

## 2020-02-13 DIAGNOSIS — R7303 Prediabetes: Secondary | ICD-10-CM | POA: Diagnosis not present

## 2020-02-13 DIAGNOSIS — E785 Hyperlipidemia, unspecified: Secondary | ICD-10-CM | POA: Diagnosis not present

## 2020-02-13 DIAGNOSIS — J45909 Unspecified asthma, uncomplicated: Secondary | ICD-10-CM | POA: Diagnosis not present

## 2020-02-13 DIAGNOSIS — Z23 Encounter for immunization: Secondary | ICD-10-CM | POA: Diagnosis not present

## 2020-02-18 DIAGNOSIS — H6123 Impacted cerumen, bilateral: Secondary | ICD-10-CM | POA: Diagnosis not present

## 2020-03-08 DIAGNOSIS — Z6821 Body mass index (BMI) 21.0-21.9, adult: Secondary | ICD-10-CM | POA: Diagnosis not present

## 2020-03-08 DIAGNOSIS — J309 Allergic rhinitis, unspecified: Secondary | ICD-10-CM | POA: Diagnosis not present

## 2020-03-15 DIAGNOSIS — R7303 Prediabetes: Secondary | ICD-10-CM | POA: Diagnosis not present

## 2020-03-15 DIAGNOSIS — F322 Major depressive disorder, single episode, severe without psychotic features: Secondary | ICD-10-CM | POA: Diagnosis not present

## 2020-03-15 DIAGNOSIS — E785 Hyperlipidemia, unspecified: Secondary | ICD-10-CM | POA: Diagnosis not present

## 2020-03-26 DIAGNOSIS — R7303 Prediabetes: Secondary | ICD-10-CM | POA: Diagnosis not present

## 2020-03-26 DIAGNOSIS — E785 Hyperlipidemia, unspecified: Secondary | ICD-10-CM | POA: Diagnosis not present

## 2020-03-31 DIAGNOSIS — J309 Allergic rhinitis, unspecified: Secondary | ICD-10-CM | POA: Diagnosis not present

## 2020-03-31 DIAGNOSIS — Z23 Encounter for immunization: Secondary | ICD-10-CM | POA: Diagnosis not present

## 2020-04-01 DIAGNOSIS — J309 Allergic rhinitis, unspecified: Secondary | ICD-10-CM | POA: Diagnosis not present

## 2020-04-02 DIAGNOSIS — Z6821 Body mass index (BMI) 21.0-21.9, adult: Secondary | ICD-10-CM | POA: Diagnosis not present

## 2020-04-02 DIAGNOSIS — R350 Frequency of micturition: Secondary | ICD-10-CM | POA: Diagnosis not present

## 2020-04-02 DIAGNOSIS — R7303 Prediabetes: Secondary | ICD-10-CM | POA: Diagnosis not present

## 2020-04-02 DIAGNOSIS — J301 Allergic rhinitis due to pollen: Secondary | ICD-10-CM | POA: Diagnosis not present

## 2020-04-02 DIAGNOSIS — N390 Urinary tract infection, site not specified: Secondary | ICD-10-CM | POA: Diagnosis not present

## 2020-04-02 DIAGNOSIS — J309 Allergic rhinitis, unspecified: Secondary | ICD-10-CM | POA: Diagnosis not present

## 2020-04-05 DIAGNOSIS — D485 Neoplasm of uncertain behavior of skin: Secondary | ICD-10-CM | POA: Diagnosis not present

## 2020-04-05 DIAGNOSIS — J301 Allergic rhinitis due to pollen: Secondary | ICD-10-CM | POA: Diagnosis not present

## 2020-04-06 DIAGNOSIS — J301 Allergic rhinitis due to pollen: Secondary | ICD-10-CM | POA: Diagnosis not present

## 2020-04-07 DIAGNOSIS — J301 Allergic rhinitis due to pollen: Secondary | ICD-10-CM | POA: Diagnosis not present

## 2020-04-08 DIAGNOSIS — J301 Allergic rhinitis due to pollen: Secondary | ICD-10-CM | POA: Diagnosis not present

## 2020-04-09 DIAGNOSIS — J301 Allergic rhinitis due to pollen: Secondary | ICD-10-CM | POA: Diagnosis not present

## 2020-04-12 DIAGNOSIS — J301 Allergic rhinitis due to pollen: Secondary | ICD-10-CM | POA: Diagnosis not present

## 2020-04-14 DIAGNOSIS — R7303 Prediabetes: Secondary | ICD-10-CM | POA: Diagnosis not present

## 2020-04-14 DIAGNOSIS — F322 Major depressive disorder, single episode, severe without psychotic features: Secondary | ICD-10-CM | POA: Diagnosis not present

## 2020-04-14 DIAGNOSIS — E785 Hyperlipidemia, unspecified: Secondary | ICD-10-CM | POA: Diagnosis not present

## 2020-04-19 DIAGNOSIS — R7303 Prediabetes: Secondary | ICD-10-CM | POA: Diagnosis not present

## 2020-04-19 DIAGNOSIS — R82998 Other abnormal findings in urine: Secondary | ICD-10-CM | POA: Diagnosis not present

## 2020-04-19 DIAGNOSIS — E441 Mild protein-calorie malnutrition: Secondary | ICD-10-CM | POA: Diagnosis not present

## 2020-04-19 DIAGNOSIS — Z6821 Body mass index (BMI) 21.0-21.9, adult: Secondary | ICD-10-CM | POA: Diagnosis not present

## 2020-04-27 DIAGNOSIS — Z6821 Body mass index (BMI) 21.0-21.9, adult: Secondary | ICD-10-CM | POA: Diagnosis not present

## 2020-04-27 DIAGNOSIS — R300 Dysuria: Secondary | ICD-10-CM | POA: Diagnosis not present

## 2020-04-27 DIAGNOSIS — M545 Low back pain, unspecified: Secondary | ICD-10-CM | POA: Diagnosis not present

## 2020-05-20 DIAGNOSIS — R059 Cough, unspecified: Secondary | ICD-10-CM | POA: Diagnosis not present

## 2020-05-20 DIAGNOSIS — Z1152 Encounter for screening for COVID-19: Secondary | ICD-10-CM | POA: Diagnosis not present

## 2020-05-20 DIAGNOSIS — J069 Acute upper respiratory infection, unspecified: Secondary | ICD-10-CM | POA: Diagnosis not present

## 2020-05-20 DIAGNOSIS — Z20822 Contact with and (suspected) exposure to covid-19: Secondary | ICD-10-CM | POA: Diagnosis not present

## 2020-05-20 DIAGNOSIS — J029 Acute pharyngitis, unspecified: Secondary | ICD-10-CM | POA: Diagnosis not present

## 2020-05-27 DIAGNOSIS — J029 Acute pharyngitis, unspecified: Secondary | ICD-10-CM | POA: Diagnosis not present

## 2020-05-27 DIAGNOSIS — Z20822 Contact with and (suspected) exposure to covid-19: Secondary | ICD-10-CM | POA: Diagnosis not present

## 2020-05-27 DIAGNOSIS — R059 Cough, unspecified: Secondary | ICD-10-CM | POA: Diagnosis not present

## 2020-05-27 DIAGNOSIS — Z1152 Encounter for screening for COVID-19: Secondary | ICD-10-CM | POA: Diagnosis not present

## 2020-05-27 DIAGNOSIS — J45909 Unspecified asthma, uncomplicated: Secondary | ICD-10-CM | POA: Diagnosis not present

## 2020-06-08 DIAGNOSIS — R059 Cough, unspecified: Secondary | ICD-10-CM | POA: Diagnosis not present

## 2020-06-08 DIAGNOSIS — Z6822 Body mass index (BMI) 22.0-22.9, adult: Secondary | ICD-10-CM | POA: Diagnosis not present

## 2020-06-08 DIAGNOSIS — J45909 Unspecified asthma, uncomplicated: Secondary | ICD-10-CM | POA: Diagnosis not present

## 2020-06-15 DIAGNOSIS — R7303 Prediabetes: Secondary | ICD-10-CM | POA: Diagnosis not present

## 2020-06-15 DIAGNOSIS — F322 Major depressive disorder, single episode, severe without psychotic features: Secondary | ICD-10-CM | POA: Diagnosis not present

## 2020-06-15 DIAGNOSIS — E785 Hyperlipidemia, unspecified: Secondary | ICD-10-CM | POA: Diagnosis not present

## 2020-06-28 DIAGNOSIS — J309 Allergic rhinitis, unspecified: Secondary | ICD-10-CM | POA: Diagnosis not present

## 2020-07-01 DIAGNOSIS — J309 Allergic rhinitis, unspecified: Secondary | ICD-10-CM | POA: Diagnosis not present

## 2020-07-05 DIAGNOSIS — J309 Allergic rhinitis, unspecified: Secondary | ICD-10-CM | POA: Diagnosis not present

## 2020-07-06 DIAGNOSIS — Z6822 Body mass index (BMI) 22.0-22.9, adult: Secondary | ICD-10-CM | POA: Diagnosis not present

## 2020-07-06 DIAGNOSIS — J45909 Unspecified asthma, uncomplicated: Secondary | ICD-10-CM | POA: Diagnosis not present

## 2020-07-08 DIAGNOSIS — J309 Allergic rhinitis, unspecified: Secondary | ICD-10-CM | POA: Diagnosis not present

## 2020-07-12 DIAGNOSIS — J309 Allergic rhinitis, unspecified: Secondary | ICD-10-CM | POA: Diagnosis not present

## 2020-07-13 DIAGNOSIS — R7303 Prediabetes: Secondary | ICD-10-CM | POA: Diagnosis not present

## 2020-07-13 DIAGNOSIS — E785 Hyperlipidemia, unspecified: Secondary | ICD-10-CM | POA: Diagnosis not present

## 2020-07-13 DIAGNOSIS — F322 Major depressive disorder, single episode, severe without psychotic features: Secondary | ICD-10-CM | POA: Diagnosis not present

## 2020-07-15 DIAGNOSIS — J309 Allergic rhinitis, unspecified: Secondary | ICD-10-CM | POA: Diagnosis not present

## 2020-07-19 DIAGNOSIS — J309 Allergic rhinitis, unspecified: Secondary | ICD-10-CM | POA: Diagnosis not present

## 2020-07-22 DIAGNOSIS — J309 Allergic rhinitis, unspecified: Secondary | ICD-10-CM | POA: Diagnosis not present

## 2020-07-26 DIAGNOSIS — J309 Allergic rhinitis, unspecified: Secondary | ICD-10-CM | POA: Diagnosis not present

## 2020-07-29 DIAGNOSIS — J309 Allergic rhinitis, unspecified: Secondary | ICD-10-CM | POA: Diagnosis not present

## 2020-08-02 DIAGNOSIS — J309 Allergic rhinitis, unspecified: Secondary | ICD-10-CM | POA: Diagnosis not present

## 2020-08-09 DIAGNOSIS — J309 Allergic rhinitis, unspecified: Secondary | ICD-10-CM | POA: Diagnosis not present

## 2020-08-13 DIAGNOSIS — R7303 Prediabetes: Secondary | ICD-10-CM | POA: Diagnosis not present

## 2020-08-13 DIAGNOSIS — J449 Chronic obstructive pulmonary disease, unspecified: Secondary | ICD-10-CM | POA: Diagnosis not present

## 2020-08-13 DIAGNOSIS — F329 Major depressive disorder, single episode, unspecified: Secondary | ICD-10-CM | POA: Diagnosis not present

## 2020-08-13 DIAGNOSIS — E785 Hyperlipidemia, unspecified: Secondary | ICD-10-CM | POA: Diagnosis not present

## 2020-08-16 DIAGNOSIS — J309 Allergic rhinitis, unspecified: Secondary | ICD-10-CM | POA: Diagnosis not present

## 2020-08-23 DIAGNOSIS — J309 Allergic rhinitis, unspecified: Secondary | ICD-10-CM | POA: Diagnosis not present

## 2020-08-24 DIAGNOSIS — Z20822 Contact with and (suspected) exposure to covid-19: Secondary | ICD-10-CM | POA: Diagnosis not present

## 2020-08-24 DIAGNOSIS — Z1152 Encounter for screening for COVID-19: Secondary | ICD-10-CM | POA: Diagnosis not present

## 2020-08-24 DIAGNOSIS — J020 Streptococcal pharyngitis: Secondary | ICD-10-CM | POA: Diagnosis not present

## 2020-08-24 DIAGNOSIS — Z6822 Body mass index (BMI) 22.0-22.9, adult: Secondary | ICD-10-CM | POA: Diagnosis not present

## 2020-08-27 DIAGNOSIS — R7303 Prediabetes: Secondary | ICD-10-CM | POA: Diagnosis not present

## 2020-08-27 DIAGNOSIS — E785 Hyperlipidemia, unspecified: Secondary | ICD-10-CM | POA: Diagnosis not present

## 2020-08-30 DIAGNOSIS — J309 Allergic rhinitis, unspecified: Secondary | ICD-10-CM | POA: Diagnosis not present

## 2020-09-03 DIAGNOSIS — F322 Major depressive disorder, single episode, severe without psychotic features: Secondary | ICD-10-CM | POA: Diagnosis not present

## 2020-09-03 DIAGNOSIS — Z6822 Body mass index (BMI) 22.0-22.9, adult: Secondary | ICD-10-CM | POA: Diagnosis not present

## 2020-09-03 DIAGNOSIS — E785 Hyperlipidemia, unspecified: Secondary | ICD-10-CM | POA: Diagnosis not present

## 2020-09-03 DIAGNOSIS — R7303 Prediabetes: Secondary | ICD-10-CM | POA: Diagnosis not present

## 2020-09-06 DIAGNOSIS — J309 Allergic rhinitis, unspecified: Secondary | ICD-10-CM | POA: Diagnosis not present

## 2020-09-12 DIAGNOSIS — F329 Major depressive disorder, single episode, unspecified: Secondary | ICD-10-CM | POA: Diagnosis not present

## 2020-09-12 DIAGNOSIS — J449 Chronic obstructive pulmonary disease, unspecified: Secondary | ICD-10-CM | POA: Diagnosis not present

## 2020-09-12 DIAGNOSIS — E785 Hyperlipidemia, unspecified: Secondary | ICD-10-CM | POA: Diagnosis not present

## 2020-09-13 DIAGNOSIS — J309 Allergic rhinitis, unspecified: Secondary | ICD-10-CM | POA: Diagnosis not present

## 2020-09-16 DIAGNOSIS — J029 Acute pharyngitis, unspecified: Secondary | ICD-10-CM | POA: Diagnosis not present

## 2020-09-16 DIAGNOSIS — J320 Chronic maxillary sinusitis: Secondary | ICD-10-CM | POA: Diagnosis not present

## 2020-09-16 DIAGNOSIS — Z6822 Body mass index (BMI) 22.0-22.9, adult: Secondary | ICD-10-CM | POA: Diagnosis not present

## 2020-09-16 DIAGNOSIS — J321 Chronic frontal sinusitis: Secondary | ICD-10-CM | POA: Diagnosis not present

## 2020-09-20 DIAGNOSIS — J309 Allergic rhinitis, unspecified: Secondary | ICD-10-CM | POA: Diagnosis not present

## 2020-09-27 DIAGNOSIS — J309 Allergic rhinitis, unspecified: Secondary | ICD-10-CM | POA: Diagnosis not present

## 2020-10-04 DIAGNOSIS — J309 Allergic rhinitis, unspecified: Secondary | ICD-10-CM | POA: Diagnosis not present

## 2020-10-07 DIAGNOSIS — J320 Chronic maxillary sinusitis: Secondary | ICD-10-CM | POA: Diagnosis not present

## 2020-10-07 DIAGNOSIS — J321 Chronic frontal sinusitis: Secondary | ICD-10-CM | POA: Diagnosis not present

## 2020-10-12 DIAGNOSIS — J309 Allergic rhinitis, unspecified: Secondary | ICD-10-CM | POA: Diagnosis not present

## 2020-10-13 DIAGNOSIS — J0110 Acute frontal sinusitis, unspecified: Secondary | ICD-10-CM | POA: Diagnosis not present

## 2020-10-13 DIAGNOSIS — E785 Hyperlipidemia, unspecified: Secondary | ICD-10-CM | POA: Diagnosis not present

## 2020-10-13 DIAGNOSIS — J321 Chronic frontal sinusitis: Secondary | ICD-10-CM | POA: Diagnosis not present

## 2020-10-13 DIAGNOSIS — F329 Major depressive disorder, single episode, unspecified: Secondary | ICD-10-CM | POA: Diagnosis not present

## 2020-10-13 DIAGNOSIS — J3489 Other specified disorders of nose and nasal sinuses: Secondary | ICD-10-CM | POA: Diagnosis not present

## 2020-10-13 DIAGNOSIS — J323 Chronic sphenoidal sinusitis: Secondary | ICD-10-CM | POA: Diagnosis not present

## 2020-10-13 DIAGNOSIS — J322 Chronic ethmoidal sinusitis: Secondary | ICD-10-CM | POA: Diagnosis not present

## 2020-10-13 DIAGNOSIS — J449 Chronic obstructive pulmonary disease, unspecified: Secondary | ICD-10-CM | POA: Diagnosis not present

## 2020-10-18 DIAGNOSIS — J309 Allergic rhinitis, unspecified: Secondary | ICD-10-CM | POA: Diagnosis not present

## 2020-10-25 DIAGNOSIS — J309 Allergic rhinitis, unspecified: Secondary | ICD-10-CM | POA: Diagnosis not present

## 2020-10-26 DIAGNOSIS — S83242A Other tear of medial meniscus, current injury, left knee, initial encounter: Secondary | ICD-10-CM | POA: Diagnosis not present

## 2020-10-26 DIAGNOSIS — S83241A Other tear of medial meniscus, current injury, right knee, initial encounter: Secondary | ICD-10-CM | POA: Diagnosis not present

## 2020-11-01 DIAGNOSIS — J309 Allergic rhinitis, unspecified: Secondary | ICD-10-CM | POA: Diagnosis not present

## 2020-11-12 DIAGNOSIS — F329 Major depressive disorder, single episode, unspecified: Secondary | ICD-10-CM | POA: Diagnosis not present

## 2020-11-12 DIAGNOSIS — J449 Chronic obstructive pulmonary disease, unspecified: Secondary | ICD-10-CM | POA: Diagnosis not present

## 2020-11-12 DIAGNOSIS — E785 Hyperlipidemia, unspecified: Secondary | ICD-10-CM | POA: Diagnosis not present

## 2020-11-16 DIAGNOSIS — J309 Allergic rhinitis, unspecified: Secondary | ICD-10-CM | POA: Diagnosis not present

## 2020-11-18 DIAGNOSIS — S83242D Other tear of medial meniscus, current injury, left knee, subsequent encounter: Secondary | ICD-10-CM | POA: Diagnosis not present

## 2020-11-18 DIAGNOSIS — S83241D Other tear of medial meniscus, current injury, right knee, subsequent encounter: Secondary | ICD-10-CM | POA: Diagnosis not present

## 2020-11-20 DIAGNOSIS — M25562 Pain in left knee: Secondary | ICD-10-CM | POA: Diagnosis not present

## 2020-11-22 DIAGNOSIS — J309 Allergic rhinitis, unspecified: Secondary | ICD-10-CM | POA: Diagnosis not present

## 2020-11-24 DIAGNOSIS — J343 Hypertrophy of nasal turbinates: Secondary | ICD-10-CM | POA: Diagnosis not present

## 2020-11-24 DIAGNOSIS — J310 Chronic rhinitis: Secondary | ICD-10-CM | POA: Diagnosis not present

## 2020-11-24 DIAGNOSIS — R0982 Postnasal drip: Secondary | ICD-10-CM | POA: Diagnosis not present

## 2020-11-29 DIAGNOSIS — J309 Allergic rhinitis, unspecified: Secondary | ICD-10-CM | POA: Diagnosis not present

## 2020-12-06 DIAGNOSIS — J309 Allergic rhinitis, unspecified: Secondary | ICD-10-CM | POA: Diagnosis not present

## 2020-12-13 DIAGNOSIS — E785 Hyperlipidemia, unspecified: Secondary | ICD-10-CM | POA: Diagnosis not present

## 2020-12-13 DIAGNOSIS — J449 Chronic obstructive pulmonary disease, unspecified: Secondary | ICD-10-CM | POA: Diagnosis not present

## 2020-12-13 DIAGNOSIS — F329 Major depressive disorder, single episode, unspecified: Secondary | ICD-10-CM | POA: Diagnosis not present

## 2020-12-13 DIAGNOSIS — J309 Allergic rhinitis, unspecified: Secondary | ICD-10-CM | POA: Diagnosis not present

## 2020-12-20 DIAGNOSIS — J309 Allergic rhinitis, unspecified: Secondary | ICD-10-CM | POA: Diagnosis not present

## 2020-12-27 DIAGNOSIS — J309 Allergic rhinitis, unspecified: Secondary | ICD-10-CM | POA: Diagnosis not present

## 2020-12-28 DIAGNOSIS — M1712 Unilateral primary osteoarthritis, left knee: Secondary | ICD-10-CM | POA: Diagnosis not present

## 2020-12-31 DIAGNOSIS — R7303 Prediabetes: Secondary | ICD-10-CM | POA: Diagnosis not present

## 2020-12-31 DIAGNOSIS — E785 Hyperlipidemia, unspecified: Secondary | ICD-10-CM | POA: Diagnosis not present

## 2021-01-03 DIAGNOSIS — J309 Allergic rhinitis, unspecified: Secondary | ICD-10-CM | POA: Diagnosis not present

## 2021-01-04 DIAGNOSIS — J310 Chronic rhinitis: Secondary | ICD-10-CM | POA: Diagnosis not present

## 2021-01-04 DIAGNOSIS — J343 Hypertrophy of nasal turbinates: Secondary | ICD-10-CM | POA: Diagnosis not present

## 2021-01-04 DIAGNOSIS — M1712 Unilateral primary osteoarthritis, left knee: Secondary | ICD-10-CM | POA: Diagnosis not present

## 2021-01-07 DIAGNOSIS — Z0000 Encounter for general adult medical examination without abnormal findings: Secondary | ICD-10-CM | POA: Diagnosis not present

## 2021-01-07 DIAGNOSIS — Z139 Encounter for screening, unspecified: Secondary | ICD-10-CM | POA: Diagnosis not present

## 2021-01-07 DIAGNOSIS — Z136 Encounter for screening for cardiovascular disorders: Secondary | ICD-10-CM | POA: Diagnosis not present

## 2021-01-07 DIAGNOSIS — Z1339 Encounter for screening examination for other mental health and behavioral disorders: Secondary | ICD-10-CM | POA: Diagnosis not present

## 2021-01-07 DIAGNOSIS — F322 Major depressive disorder, single episode, severe without psychotic features: Secondary | ICD-10-CM | POA: Diagnosis not present

## 2021-01-07 DIAGNOSIS — R7303 Prediabetes: Secondary | ICD-10-CM | POA: Diagnosis not present

## 2021-01-07 DIAGNOSIS — Z1331 Encounter for screening for depression: Secondary | ICD-10-CM | POA: Diagnosis not present

## 2021-01-07 DIAGNOSIS — E785 Hyperlipidemia, unspecified: Secondary | ICD-10-CM | POA: Diagnosis not present

## 2021-01-10 DIAGNOSIS — J309 Allergic rhinitis, unspecified: Secondary | ICD-10-CM | POA: Diagnosis not present

## 2021-01-11 DIAGNOSIS — M1712 Unilateral primary osteoarthritis, left knee: Secondary | ICD-10-CM | POA: Diagnosis not present

## 2021-01-13 DIAGNOSIS — F329 Major depressive disorder, single episode, unspecified: Secondary | ICD-10-CM | POA: Diagnosis not present

## 2021-01-13 DIAGNOSIS — J449 Chronic obstructive pulmonary disease, unspecified: Secondary | ICD-10-CM | POA: Diagnosis not present

## 2021-01-13 DIAGNOSIS — E785 Hyperlipidemia, unspecified: Secondary | ICD-10-CM | POA: Diagnosis not present

## 2021-01-18 DIAGNOSIS — J309 Allergic rhinitis, unspecified: Secondary | ICD-10-CM | POA: Diagnosis not present

## 2021-01-25 DIAGNOSIS — J309 Allergic rhinitis, unspecified: Secondary | ICD-10-CM | POA: Diagnosis not present

## 2021-01-31 DIAGNOSIS — J309 Allergic rhinitis, unspecified: Secondary | ICD-10-CM | POA: Diagnosis not present

## 2021-02-07 DIAGNOSIS — J309 Allergic rhinitis, unspecified: Secondary | ICD-10-CM | POA: Diagnosis not present

## 2021-02-12 DIAGNOSIS — E785 Hyperlipidemia, unspecified: Secondary | ICD-10-CM | POA: Diagnosis not present

## 2021-02-12 DIAGNOSIS — J449 Chronic obstructive pulmonary disease, unspecified: Secondary | ICD-10-CM | POA: Diagnosis not present

## 2021-02-12 DIAGNOSIS — F329 Major depressive disorder, single episode, unspecified: Secondary | ICD-10-CM | POA: Diagnosis not present

## 2021-02-15 DIAGNOSIS — J309 Allergic rhinitis, unspecified: Secondary | ICD-10-CM | POA: Diagnosis not present

## 2021-02-21 DIAGNOSIS — J309 Allergic rhinitis, unspecified: Secondary | ICD-10-CM | POA: Diagnosis not present

## 2021-02-23 DIAGNOSIS — Z23 Encounter for immunization: Secondary | ICD-10-CM | POA: Diagnosis not present

## 2021-02-28 DIAGNOSIS — J309 Allergic rhinitis, unspecified: Secondary | ICD-10-CM | POA: Diagnosis not present

## 2021-03-01 ENCOUNTER — Other Ambulatory Visit: Payer: Self-pay | Admitting: Family Medicine

## 2021-03-01 DIAGNOSIS — Z1231 Encounter for screening mammogram for malignant neoplasm of breast: Secondary | ICD-10-CM

## 2021-03-07 DIAGNOSIS — J309 Allergic rhinitis, unspecified: Secondary | ICD-10-CM | POA: Diagnosis not present

## 2021-03-15 DIAGNOSIS — J449 Chronic obstructive pulmonary disease, unspecified: Secondary | ICD-10-CM | POA: Diagnosis not present

## 2021-03-15 DIAGNOSIS — E785 Hyperlipidemia, unspecified: Secondary | ICD-10-CM | POA: Diagnosis not present

## 2021-03-15 DIAGNOSIS — F329 Major depressive disorder, single episode, unspecified: Secondary | ICD-10-CM | POA: Diagnosis not present

## 2021-03-16 DIAGNOSIS — J309 Allergic rhinitis, unspecified: Secondary | ICD-10-CM | POA: Diagnosis not present

## 2021-03-23 ENCOUNTER — Ambulatory Visit
Admission: RE | Admit: 2021-03-23 | Discharge: 2021-03-23 | Disposition: A | Discharge status: Home / Self Care | Payer: Medicare Other | Source: Ambulatory Visit | Attending: Family Medicine | Admitting: Family Medicine

## 2021-03-23 ENCOUNTER — Other Ambulatory Visit: Payer: Self-pay

## 2021-03-23 DIAGNOSIS — Z1231 Encounter for screening mammogram for malignant neoplasm of breast: Secondary | ICD-10-CM | POA: Diagnosis not present

## 2021-04-12 DIAGNOSIS — E785 Hyperlipidemia, unspecified: Secondary | ICD-10-CM | POA: Diagnosis not present

## 2021-04-12 DIAGNOSIS — R7303 Prediabetes: Secondary | ICD-10-CM | POA: Diagnosis not present

## 2021-04-14 DIAGNOSIS — E785 Hyperlipidemia, unspecified: Secondary | ICD-10-CM | POA: Diagnosis not present

## 2021-04-14 DIAGNOSIS — J449 Chronic obstructive pulmonary disease, unspecified: Secondary | ICD-10-CM | POA: Diagnosis not present

## 2021-04-14 DIAGNOSIS — F329 Major depressive disorder, single episode, unspecified: Secondary | ICD-10-CM | POA: Diagnosis not present

## 2021-04-18 DIAGNOSIS — F322 Major depressive disorder, single episode, severe without psychotic features: Secondary | ICD-10-CM | POA: Diagnosis not present

## 2021-04-18 DIAGNOSIS — E785 Hyperlipidemia, unspecified: Secondary | ICD-10-CM | POA: Diagnosis not present

## 2021-04-18 DIAGNOSIS — Z6822 Body mass index (BMI) 22.0-22.9, adult: Secondary | ICD-10-CM | POA: Diagnosis not present

## 2021-04-18 DIAGNOSIS — R7303 Prediabetes: Secondary | ICD-10-CM | POA: Diagnosis not present

## 2021-05-15 DIAGNOSIS — E785 Hyperlipidemia, unspecified: Secondary | ICD-10-CM | POA: Diagnosis not present

## 2021-05-15 DIAGNOSIS — F329 Major depressive disorder, single episode, unspecified: Secondary | ICD-10-CM | POA: Diagnosis not present

## 2021-05-15 DIAGNOSIS — R7303 Prediabetes: Secondary | ICD-10-CM | POA: Diagnosis not present

## 2021-06-15 DIAGNOSIS — R7303 Prediabetes: Secondary | ICD-10-CM | POA: Diagnosis not present

## 2021-06-15 DIAGNOSIS — E785 Hyperlipidemia, unspecified: Secondary | ICD-10-CM | POA: Diagnosis not present

## 2021-06-15 DIAGNOSIS — F329 Major depressive disorder, single episode, unspecified: Secondary | ICD-10-CM | POA: Diagnosis not present

## 2021-06-17 DIAGNOSIS — E860 Dehydration: Secondary | ICD-10-CM | POA: Diagnosis not present

## 2021-06-17 DIAGNOSIS — R1084 Generalized abdominal pain: Secondary | ICD-10-CM | POA: Diagnosis not present

## 2021-06-17 DIAGNOSIS — N390 Urinary tract infection, site not specified: Secondary | ICD-10-CM | POA: Diagnosis not present

## 2021-06-21 DIAGNOSIS — I700 Atherosclerosis of aorta: Secondary | ICD-10-CM | POA: Diagnosis not present

## 2021-06-21 DIAGNOSIS — K5730 Diverticulosis of large intestine without perforation or abscess without bleeding: Secondary | ICD-10-CM | POA: Diagnosis not present

## 2021-06-21 DIAGNOSIS — R109 Unspecified abdominal pain: Secondary | ICD-10-CM | POA: Diagnosis not present

## 2021-06-21 DIAGNOSIS — K449 Diaphragmatic hernia without obstruction or gangrene: Secondary | ICD-10-CM | POA: Diagnosis not present

## 2021-06-23 DIAGNOSIS — Z139 Encounter for screening, unspecified: Secondary | ICD-10-CM | POA: Diagnosis not present

## 2021-06-23 DIAGNOSIS — K5900 Constipation, unspecified: Secondary | ICD-10-CM | POA: Diagnosis not present

## 2021-06-23 DIAGNOSIS — Z1211 Encounter for screening for malignant neoplasm of colon: Secondary | ICD-10-CM | POA: Diagnosis not present

## 2021-06-23 DIAGNOSIS — Z6822 Body mass index (BMI) 22.0-22.9, adult: Secondary | ICD-10-CM | POA: Diagnosis not present

## 2021-06-29 ENCOUNTER — Encounter: Payer: Self-pay | Admitting: Gastroenterology

## 2021-07-13 DIAGNOSIS — E785 Hyperlipidemia, unspecified: Secondary | ICD-10-CM | POA: Diagnosis not present

## 2021-07-13 DIAGNOSIS — R7303 Prediabetes: Secondary | ICD-10-CM | POA: Diagnosis not present

## 2021-07-22 ENCOUNTER — Encounter: Payer: Self-pay | Admitting: Gastroenterology

## 2021-07-22 ENCOUNTER — Other Ambulatory Visit: Payer: Self-pay

## 2021-07-22 ENCOUNTER — Ambulatory Visit (INDEPENDENT_AMBULATORY_CARE_PROVIDER_SITE_OTHER): Payer: Medicare Other | Admitting: Gastroenterology

## 2021-07-22 VITALS — BP 152/82 | HR 82 | Ht 65.0 in | Wt 143.0 lb

## 2021-07-22 DIAGNOSIS — K625 Hemorrhage of anus and rectum: Secondary | ICD-10-CM | POA: Diagnosis not present

## 2021-07-22 DIAGNOSIS — K581 Irritable bowel syndrome with constipation: Secondary | ICD-10-CM

## 2021-07-22 MED ORDER — LINACLOTIDE 72 MCG PO CAPS: 72.0000 ug | ORAL_CAPSULE | Freq: Every day | ORAL | 3 refills | Status: DC

## 2021-07-22 NOTE — Progress Notes (Addendum)
? ? ?Chief Complaint: For colon ? ?Referring Provider:  Marco Collie, MD    ? ? ?ASSESSMENT AND PLAN;  ? ?#1. IBS-C with recent sigmoid diverticulitis, treated with A/Bs.  ? ?#2. Rectal bleeding likely d/t hoids. Nl CBC ? ?Plan ?-Miralax 17g po BID until large BM, then QD ?-Linzess 72 mcg QD (samples) ?-Colon with 2 day prep in May 2023 ?-Minimize nonsteroidals including Mobic. ?-Increase water intake ?-Start exercising like walking 30 min/day. ?-D/W pt's husband. ?-CT reviewed independently and with pt. ? ? ?Discussed risks & benefits of colonoscopy. Risks including rare perforation req laparotomy, bleeding after bx/polypectomy req blood transfusion, rarely missing neoplasms, risks of anesthesia/sedation, rare risk of damage to internal organs. Benefits outweigh the risks. Patient agrees to proceed. All the questions were answered. Pt consents to proceed. ?HPI:   ? ?Donna Pugh is a 71 y.o. female  ?With HLD, IBS, OA, asthma, anxiety/depression, S/P appendicectomy, cholecystectomy, hysterectomy ? ?Had H/O sigmoid diverticulitis Jan 2022, treated with cipro/flagyl, continued abdominal pain.  Underwent CT Abdo/pelvis with contrast February 2022 as below which was neg for diverticular abscess.  Had normal CBC, CMP at Dr. Nyra Capes office. ? ?Had significant constipation prior to above.  Has been passing pellet-like stools. ? ?Now on stool softners/miralax. Still with constipation 1 BM/3 days.  Has abdominal bloating, generalized Abdo pain which gets better with BMs.  She has longstanding history of IBS-C.  ? ?With occ bright red blood mostly away from the stool.  She has used Preparation H with some relief. ? ?Denies having any N/V. ? ?Past GI work-up: ?Colonoscopy 06/23/2011 (PCF with some difficulty assisted by abdominal pressure): Fair prep ?-Mild pancolonic diverticulosis predominantly in the sigmoid colon. ?-Small internal hemorrhoids ?-Repeat in 5 years ?-Had hard time passing gas after colon requiring x-ray  KUB ? ?CT AP with contrast 06/21/2021 at [facility]: ?Colonic diverticulosis, without radiographic evidence of ?diverticulitis or other acute findings. ?Small hiatal hernia. ?Aortic Atherosclerosis (ICD10-I70.0). ? ? ?Past Medical History:  ?Diagnosis Date  ? Asthma   ? Depression   ? History of diverticulitis   ? Hypercholesterolemia   ? IBS (irritable bowel syndrome)   ? OA (osteoarthritis)   ? ? ?Past Surgical History:  ?Procedure Laterality Date  ? APPENDECTOMY    ? BLADDER SURGERY  2014  ? vagina and rectal tacked  ? CHOLECYSTECTOMY    ? COLONOSCOPY  06/23/2011  ? [place] colonic diverticulosis (predominantly in the sigmoid colon) Small internal hemorrhoids  ? NASAL SINUS SURGERY    ? x2 2007 and 2010  ? TONSILLECTOMY    ? VAGINAL HYSTERECTOMY    ? ? ?Family History  ?Problem Relation Age of Onset  ? Heart disease Mother   ? Liver disease Mother   ? Heart disease Father   ? Diabetes Father   ? Diabetes Sister   ? Diabetes Brother   ? Breast cancer Neg Hx   ? Colon cancer Neg Hx   ? Rectal cancer Neg Hx   ? Stomach cancer Neg Hx   ? ? ?Social History  ? ?Tobacco Use  ? Smoking status: Never  ? Smokeless tobacco: Never  ?Vaping Use  ? Vaping Use: Never used  ?Substance Use Topics  ? Drug use: Never  ? ? ?Current Outpatient Medications  ?Medication Sig Dispense Refill  ? aspirin EC 81 MG tablet Take 81 mg by mouth daily. Swallow whole.    ? budesonide-formoterol (SYMBICORT) 160-4.5 MCG/ACT inhaler SMARTSIG:2 Puff(s) By Mouth Twice Daily    ?  Cholecalciferol (VITAMIN D) 50 MCG (2000 UT) tablet Take 2,000 Units by mouth daily.    ? DULoxetine (CYMBALTA) 60 MG capsule Take 60 mg by mouth daily.    ? meloxicam (MOBIC) 15 MG tablet Take 15 mg by mouth daily as needed.    ? omeprazole (PRILOSEC) 40 MG capsule Take 40 mg by mouth daily.    ? QUEtiapine (SEROQUEL) 25 MG tablet Take 25 mg by mouth daily.    ? simvastatin (ZOCOR) 40 MG tablet Take 40 mg by mouth at bedtime.    ? ?No current facility-administered  medications for this visit.  ? ? ?Not on File ? ?Review of Systems:  ?Constitutional: Denies fever, chills, diaphoresis, appetite change and fatigue.  ?HEENT: Has allergies ?Respiratory: Denies SOB, DOE, cough, chest tightness,  and wheezing.   ?Cardiovascular: Denies chest pain, palpitations and leg swelling.  ?Genitourinary: Denies dysuria, urgency, frequency, hematuria, flank pain and difficulty urinating.  Has excessive urination. ?Musculoskeletal: Denies myalgias, back pain, joint swelling, arthralgias and gait problem.  ?Skin: No rash.  ?Neurological: Denies dizziness, seizures, syncope, weakness, light-headedness, numbness and headaches.  ?Hematological: Denies adenopathy. Easy bruising, personal or family bleeding history  ?Psychiatric/Behavioral: Has anxiety or depression ? ?  ? ?Physical Exam:   ? ?BP (!) 152/82   Pulse 82   Ht '5\' 5"'$  (1.651 m)   Wt 143 lb (64.9 kg)   SpO2 98%   BMI 23.80 kg/m?  ?Wt Readings from Last 3 Encounters:  ?07/22/21 143 lb (64.9 kg)  ? ?Constitutional:  Well-developed, in no acute distress. ?Psychiatric: Normal mood and affect. Behavior is normal. ?HEENT: Pupils normal.  Conjunctivae are normal. No scleral icterus. ?Neck supple.  ?Cardiovascular: Normal rate, regular rhythm. No edema ?Pulmonary/chest: Effort normal and breath sounds normal. No wheezing, rales or rhonchi. ?Abdominal: Soft, nondistended.  Diffuse abdominal wall tenderness.  Bowel sounds active throughout. There are no masses palpable. No hepatomegaly. ?Rectal: Deferred ?Neurological: Alert and oriented to person place and time. ?Skin: Skin is warm and dry. No rashes noted. ? ? ? ? ? ? ?Carmell Austria, MD 07/22/2021, 9:20 AM ? ?Cc: Marco Collie, MD ? ? ?

## 2021-07-22 NOTE — Patient Instructions (Addendum)
If you are age 71 or older, your body mass index should be between 23-30. Your Body mass index is 23.8 kg/m?Donna Pugh If this is out of the aforementioned range listed, please consider follow up with your Primary Care Provider. ? ?If you are age 67 or younger, your body mass index should be between 19-25. Your Body mass index is 23.8 kg/m?Donna Pugh If this is out of the aformentioned range listed, please consider follow up with your Primary Care Provider.  ? ?________________________________________________________ ? ?The [place] [facility] providers would like to encourage you to use [facility] to communicate with providers for non-urgent requests or questions.  Due to long hold times on the telephone, sending your provider a message by [facility] may be a faster and more efficient way to get a response.  Please allow 48 business hours for a response.  Please remember that this is for non-urgent requests.  ?_______________________________________________________ ? ?Please purchase the following medications over the counter and take as directed: ?Miralax 17g 2 times a day until large BM and then do 17g daily. ? ?We have given you samples of the following medication to take: ?Linzess '72mg'$   you were given a patient assistance application for this as well that was signed by the doctor. ? ?It has been recommended to you by your physician that you have a(n) Colon completed. Please contact our office at [phone] in April to see about a June appointment. You will have to do Two days before your procedure: Mix 3 packs (or capfuls) of Miralax in 48 ounces of clear liquid and drink at 6pm. ? ?Please call with any questions or concerns. ? ?Thank you, ? ?Dr. Jackquline Denmark ? ? ?

## 2021-08-02 DIAGNOSIS — K5904 Chronic idiopathic constipation: Secondary | ICD-10-CM | POA: Diagnosis not present

## 2021-08-02 DIAGNOSIS — K5730 Diverticulosis of large intestine without perforation or abscess without bleeding: Secondary | ICD-10-CM | POA: Diagnosis not present

## 2021-08-13 DIAGNOSIS — J45909 Unspecified asthma, uncomplicated: Secondary | ICD-10-CM | POA: Diagnosis not present

## 2021-08-13 DIAGNOSIS — E785 Hyperlipidemia, unspecified: Secondary | ICD-10-CM | POA: Diagnosis not present

## 2021-08-15 DIAGNOSIS — R7303 Prediabetes: Secondary | ICD-10-CM | POA: Diagnosis not present

## 2021-08-15 DIAGNOSIS — E785 Hyperlipidemia, unspecified: Secondary | ICD-10-CM | POA: Diagnosis not present

## 2021-08-15 DIAGNOSIS — R1032 Left lower quadrant pain: Secondary | ICD-10-CM | POA: Diagnosis not present

## 2021-08-15 DIAGNOSIS — R1031 Right lower quadrant pain: Secondary | ICD-10-CM | POA: Diagnosis not present

## 2021-08-15 DIAGNOSIS — R197 Diarrhea, unspecified: Secondary | ICD-10-CM | POA: Diagnosis not present

## 2021-08-15 DIAGNOSIS — K5904 Chronic idiopathic constipation: Secondary | ICD-10-CM | POA: Diagnosis not present

## 2021-08-22 DIAGNOSIS — F322 Major depressive disorder, single episode, severe without psychotic features: Secondary | ICD-10-CM | POA: Diagnosis not present

## 2021-08-22 DIAGNOSIS — R1084 Generalized abdominal pain: Secondary | ICD-10-CM | POA: Diagnosis not present

## 2021-08-22 DIAGNOSIS — R7303 Prediabetes: Secondary | ICD-10-CM | POA: Diagnosis not present

## 2021-08-22 DIAGNOSIS — E785 Hyperlipidemia, unspecified: Secondary | ICD-10-CM | POA: Diagnosis not present

## 2021-08-22 DIAGNOSIS — Z23 Encounter for immunization: Secondary | ICD-10-CM | POA: Diagnosis not present

## 2021-09-02 DIAGNOSIS — R350 Frequency of micturition: Secondary | ICD-10-CM | POA: Diagnosis not present

## 2021-09-02 DIAGNOSIS — N3281 Overactive bladder: Secondary | ICD-10-CM | POA: Diagnosis not present

## 2021-09-02 DIAGNOSIS — Z6823 Body mass index (BMI) 23.0-23.9, adult: Secondary | ICD-10-CM | POA: Diagnosis not present

## 2021-09-09 ENCOUNTER — Other Ambulatory Visit: Payer: Self-pay

## 2021-09-12 ENCOUNTER — Encounter: Payer: Self-pay | Admitting: Nurse Practitioner

## 2021-09-12 ENCOUNTER — Ambulatory Visit (INDEPENDENT_AMBULATORY_CARE_PROVIDER_SITE_OTHER): Payer: Medicare Other | Admitting: Nurse Practitioner

## 2021-09-12 VITALS — BP 102/60 | HR 66 | Ht 64.0 in | Wt 144.4 lb

## 2021-09-12 DIAGNOSIS — E785 Hyperlipidemia, unspecified: Secondary | ICD-10-CM | POA: Diagnosis not present

## 2021-09-12 DIAGNOSIS — K5732 Diverticulitis of large intestine without perforation or abscess without bleeding: Secondary | ICD-10-CM

## 2021-09-12 DIAGNOSIS — K921 Melena: Secondary | ICD-10-CM | POA: Diagnosis not present

## 2021-09-12 DIAGNOSIS — J45909 Unspecified asthma, uncomplicated: Secondary | ICD-10-CM | POA: Diagnosis not present

## 2021-09-12 DIAGNOSIS — R1012 Left upper quadrant pain: Secondary | ICD-10-CM | POA: Diagnosis not present

## 2021-09-12 DIAGNOSIS — R1030 Lower abdominal pain, unspecified: Secondary | ICD-10-CM | POA: Diagnosis not present

## 2021-09-12 NOTE — Patient Instructions (Signed)
You have been scheduled for an EGD and Colonoscopy. Please follow the written instructions given to you at your visit today. ?If you use inhalers (even only as needed), please bring them with you on the day of your procedure. ? ?Continue Miralax as tolerated. ? ?Thank you for trusting me with your gastrointestinal care!   ? ?Noralyn Pick, CRNP ? ? ? ?BMI: ? ?If you are age 71 or older, your body mass index should be between 23-30. Your Body mass index is 24.78 kg/m?Marland Kitchen If this is out of the aforementioned range listed, please consider follow up with your Primary Care Provider. ? ?If you are age 36 or younger, your body mass index should be between 19-25. Your Body mass index is 24.78 kg/m?Marland Kitchen If this is out of the aformentioned range listed, please consider follow up with your Primary Care Provider.  ? ?MY CHART: ? ?The [place] [facility] providers would like to encourage you to use [facility] to communicate with providers for non-urgent requests or questions.  Due to long hold times on the telephone, sending your provider a message by [facility] may be a faster and more efficient way to get a response.  Please allow 48 business hours for a response.  Please remember that this is for non-urgent requests.  ? ?

## 2021-09-12 NOTE — Progress Notes (Addendum)
? ? ? ?09/12/2021 ?Donna Pugh ?[id number] ?22-Jun-1950 ? ? ?Chief Complaint: Schedule colonoscopy ? ?History of Present Illness: Donna Pugh is a 71 year old female with a past medical history of osteoarthritis, asthma, hyperlipidemia, IBS, chronic constipation and sigmoid diverticulitis 06/2021. She developed lower abdominal pain and she was prescribed Cipro and Flagyl  per her PCP.  Approximately one week later, she underwent a CTAP 06/21/2021 at [facility] as an outpatient showed diverticulosis without evidence of diverticulitis. Her lower abdominal pain abated several days after she took the antibiotics.   ? ?She was last seen by Dr. Lyndel Safe on 07/22/2021 for diverticulitis follow up. At that time, a colonoscopy with a 2 day bowel prep to be scheduled in May 2023 was recommended.  Her most recent colonoscopy was 06/23/2011 which identified mild pancolonic diverticulosis and small internal hemorrhoids.  The bowel prep was noted as fair. She presents today to schedule a colonoscopy.  ? ?She denies having any further significant abdominal pain. She is taking MiraLAX once or twice daily which results in passing a soft and mushy stool, no diarrhea.  As long as she passes a soft stool, she does not experience any lower abdominal pain.  If she passes a hard stool she develops lower abdominal pain.  Infrequent rectal bleeding. She reported passing a solid marble like lax stools for 2 to 3 days prior to her diagnosis of diverticulitis, the black stools occurred prior to taking the antibiotics as prescribed by her PCP.  No Pepto-Bismol or iron use.  No further black stools.  She denies having any dysphagia or heartburn.  She reports having a history of a hiatal hernia which was possibly identified by prior image studies.  She denies ever having an EGD.   ?  ?  ?CTAP with contrast 06/21/2021 at [facility]: ?Colonic diverticulosis, without radiographic evidence of ?diverticulitis or other acute findings. ?Small  hiatal hernia. ?Aortic Atherosclerosis  ? ?Colonoscopy 06/23/2011 (PCF with some difficulty assisted by abdominal pressure): Fair prep ?-Mild pancolonic diverticulosis predominantly in the sigmoid colon. ?-Small internal hemorrhoids ?-Repeat in 5 years ?-Had hard time passing gas after colon requiring x-ray KUB ? ?Current Outpatient Medications on File Prior to Visit  ?Medication Sig Dispense Refill  ? albuterol (VENTOLIN HFA) 108 (90 Base) MCG/ACT inhaler Inhale 2 puffs into the lungs as needed.    ? aspirin EC 81 MG tablet Take 81 mg by mouth daily. Swallow whole.    ? bifidobacterium infantis (ALIGN) capsule Take 1 capsule by mouth daily.    ? budesonide-formoterol (SYMBICORT) 160-4.5 MCG/ACT inhaler SMARTSIG:2 Puff(s) By Mouth Twice Daily    ? Cholecalciferol (VITAMIN D) 50 MCG (2000 UT) tablet Take 2,000 Units by mouth daily.    ? dicyclomine (BENTYL) 10 MG capsule Take 10 mg by mouth every 6 (six) hours as needed.    ? DULoxetine (CYMBALTA) 60 MG capsule Take 60 mg by mouth daily.    ? fexofenadine (ALLEGRA) 180 MG tablet Take 1 tablet by mouth daily.    ? meloxicam (MOBIC) 15 MG tablet Take 15 mg by mouth daily as needed.    ? omeprazole (PRILOSEC) 40 MG capsule Take 40 mg by mouth daily.    ? polyethylene glycol powder (GLYCOLAX/MIRALAX) 17 GM/SCOOP powder Take 17-34 g by mouth daily.    ? psyllium (REGULOID) 0.52 g capsule Take 0.52 g by mouth daily.    ? QUEtiapine (SEROQUEL) 25 MG tablet Take 25 mg by mouth daily.    ? simvastatin (  ZOCOR) 40 MG tablet Take 40 mg by mouth at bedtime.    ? Vibegron (GEMTESA) 75 MG TABS Take 1 tablet by mouth daily.    ? ?No current facility-administered medications on file prior to visit.  ? ?No Known drug allergies ? ?Current Medications, Allergies, Past Medical History, Past Surgical History, Family History and Social History were reviewed in [employer] record. ? ? ?Review of Systems:   ?Constitutional: Negative for fever, sweats, chills or weight  loss.  ?Respiratory: Negative for shortness of breath.   ?Cardiovascular: Negative for chest pain, palpitations and leg swelling.  ?Gastrointestinal: See HPI.  ?Musculoskeletal: Negative for back pain or muscle aches.  ?Neurological: Negative for dizziness, headaches or paresthesias.  ? ? ?Physical Exam: ?BP 102/60   Pulse 66   Ht '5\' 4"'$  (1.626 m) Comment: height measured without shoes  Wt 144 lb 6 oz (65.5 kg)   BMI 24.78 kg/m?  ? ?General: Pleasant 71 year old female in no acute distress. ?Head: Normocephalic and atraumatic. ?Eyes: No scleral icterus. Conjunctiva pink . ?Ears: Normal auditory acuity. ?Mouth: Dentition intact. No ulcers or lesions.  ?Lungs: Clear throughout to auscultation. ?Heart: Regular rate and rhythm, no murmur. ?Abdomen: Soft, nontender and nondistended. No masses or hepatomegaly. Normal bowel sounds x 4 quadrants.  ?Rectal: Deferred.  ?Musculoskeletal: Symmetrical with no gross deformities. ?Extremities: No edema. ?Neurological: Alert oriented x 4. No focal deficits.  ?Psychological: Alert and cooperative. Normal mood and affect ? ?Assessment and Recommendations: ? ?66) 71 year old female with a history of sigmoid diverticulitis early Feb. 2023 treated with Cipro and Flagyl -> CTAP 06/21/2021 (done after antibiotics were initiated) showed diverticulosis without evidence of diverticulitis. Colonoscopy 2013 showed mild pandiverticulosis, fair bowel prep. Patient noted having difficulty passing gas s/p colonoscopy, KUB was negative.  ?-Colonoscopy with a 2 day bowel prep benefits and risks discussed including risk with sedation, risk of bleeding, perforation and infection  ?-Patient to contact office if abdominal pain recurs  ?-Request most recent CBC and CMP from PCPs office ? ?2) Black stools x 2 to 3 days. Reported history of a hiatal hernia.  She infrequently takes Meloxicam. ?-EGD at time of colonoscopy. EGD benefits and risks discussed including risk with sedation, risk of bleeding,  perforation and infection. Dr. Lyndel Safe to verify if EGD warranted at time of colonoscopy.  ?-Patient to contact office if black stools recur ? ?3) Constipation ?-Continue Miralax once or twice daily as needed  ? ? ? ?ADDENDUM: Dr. Lyndel Safe verified EGD to be done at time of colonoscopy.  ? ? ? ? ?

## 2021-09-16 DIAGNOSIS — Z6823 Body mass index (BMI) 23.0-23.9, adult: Secondary | ICD-10-CM | POA: Diagnosis not present

## 2021-09-16 DIAGNOSIS — R350 Frequency of micturition: Secondary | ICD-10-CM | POA: Diagnosis not present

## 2021-09-25 NOTE — Progress Notes (Signed)
Agree with assessment/plan ?Proceed with EGD/colonoscopy ?RG ?

## 2021-10-13 DIAGNOSIS — J45909 Unspecified asthma, uncomplicated: Secondary | ICD-10-CM | POA: Diagnosis not present

## 2021-10-13 DIAGNOSIS — E785 Hyperlipidemia, unspecified: Secondary | ICD-10-CM | POA: Diagnosis not present

## 2021-10-24 ENCOUNTER — Ambulatory Visit (AMBULATORY_SURGERY_CENTER): Payer: Medicare Other | Admitting: Gastroenterology

## 2021-10-24 ENCOUNTER — Encounter: Payer: Self-pay | Admitting: Gastroenterology

## 2021-10-24 VITALS — BP 123/74 | HR 71 | Temp 96.8°F | Resp 14 | Ht 64.0 in | Wt 144.0 lb

## 2021-10-24 DIAGNOSIS — R1030 Lower abdominal pain, unspecified: Secondary | ICD-10-CM

## 2021-10-24 DIAGNOSIS — K921 Melena: Secondary | ICD-10-CM | POA: Diagnosis not present

## 2021-10-24 DIAGNOSIS — K648 Other hemorrhoids: Secondary | ICD-10-CM

## 2021-10-24 DIAGNOSIS — K2970 Gastritis, unspecified, without bleeding: Secondary | ICD-10-CM

## 2021-10-24 DIAGNOSIS — K449 Diaphragmatic hernia without obstruction or gangrene: Secondary | ICD-10-CM | POA: Diagnosis not present

## 2021-10-24 DIAGNOSIS — R1013 Epigastric pain: Secondary | ICD-10-CM | POA: Diagnosis not present

## 2021-10-24 DIAGNOSIS — K5732 Diverticulitis of large intestine without perforation or abscess without bleeding: Secondary | ICD-10-CM

## 2021-10-24 DIAGNOSIS — K317 Polyp of stomach and duodenum: Secondary | ICD-10-CM | POA: Diagnosis not present

## 2021-10-24 MED ORDER — SODIUM CHLORIDE 0.9 % IV SOLN: 500.0000 mL | Freq: Once | INTRAVENOUS | Status: DC

## 2021-10-24 NOTE — Op Note (Signed)
[place] Patient Name: Donna Pugh Procedure Date: 10/24/2021 1:29 PM MRN: [id number] Endoscopist: Jackquline Denmark , MD Age: 71 Referring MD:  Date of Birth: March 29, 1951 Gender: Female Account #: [id number] Procedure:                Upper GI endoscopy Indications:              Epigastric abdominal pain.?H/O melena. Medicines:                Monitored Anesthesia Care Procedure:                Pre-Anesthesia Assessment:                           - Prior to the procedure, a History and Physical                            was performed, and patient medications and                            allergies were reviewed. The patient's tolerance of                            previous anesthesia was also reviewed. The risks                            and benefits of the procedure and the sedation                            options and risks were discussed with the patient.                            All questions were answered, and informed consent                            was obtained. Prior Anticoagulants: The patient has                            taken no previous anticoagulant or antiplatelet                            agents. ASA Grade Assessment: II - A patient with                            mild systemic disease. After reviewing the risks                            and benefits, the patient was deemed in                            satisfactory condition to undergo the procedure.                           After obtaining informed consent, the endoscope was  passed under direct vision. Throughout the                            procedure, the patient's blood pressure, pulse, and                            oxygen saturations were monitored continuously. The                            GIF [id number] #[id number] was introduced through the                            mouth, and advanced to the second part of duodenum.                            The upper GI  endoscopy was accomplished without                            difficulty. The patient tolerated the procedure                            well. Scope In: Scope Out: Findings:                 The lower third of the esophagus was mildly                            tortuous but normal with well-defined Z-line at 35                            cm.                           A 5 cm hiatal hernia was present extending from 35                            up to 40 cm. No [place] erosions. No active                            bleeding.                           Localized mild inflammation characterized by                            erythema was found in the gastric antrum. Biopsies                            were taken with a cold forceps for histology.                           A few 4 to 6 mm sessile polyps with no bleeding and                            no stigmata of  recent bleeding were found in the                            gastric body. Biopsies were taken with a cold                            forceps for histology.                           The examined duodenum was normal. Complications:            No immediate complications. Estimated Blood Loss:     Estimated blood loss: none. Impression:               - Mod hiatal hernia.                           - Gastritis. Biopsied.                           - A few gastric polyps. Biopsied. Recommendation:           - Patient has a contact number available for                            emergencies. The signs and symptoms of potential                            delayed complications were discussed with the                            patient. Return to normal activities tomorrow.                            Written discharge instructions were provided to the                            patient.                           - Resume previous diet.                           - Continue present medications.                           - Nonpharmacologic  means of reflux control.                           - Await pathology results.                           - The findings and recommendations were discussed                            with the patient's family. Jackquline Denmark, MD 10/24/2021 1:47:51 PM This report has been signed electronically.

## 2021-10-24 NOTE — Op Note (Signed)
[name] Procedure Date: 10/24/2021 1:27 PM MRN: [id number] Endoscopist: Jackquline Denmark , MD Age: 71 Referring MD:  Date of Birth: 1951-01-05 Gender: Female Account #: [id number] Procedure:                Colonoscopy Indications:              Screening for colorectal malignant neoplasm. H/o                            recent diverticulitis-treated with Cipro/Flagyl. Medicines:                Monitored Anesthesia Care Procedure:                Pre-Anesthesia Assessment:                           - Prior to the procedure, a History and Physical                            was performed, and patient medications and                            allergies were reviewed. The patient's tolerance of                            previous anesthesia was also reviewed. The risks                            and benefits of the procedure and the sedation                            options and risks were discussed with the patient.                            All questions were answered, and informed consent                            was obtained. Prior Anticoagulants: The patient has                            taken no previous anticoagulant or antiplatelet                            agents. ASA Grade Assessment: II - A patient with                            mild systemic disease. After reviewing the risks                            and benefits, the patient was deemed in                            satisfactory condition to undergo the procedure.  After obtaining informed consent, the colonoscope                            was passed under direct vision. Throughout the                            procedure, the patient's blood pressure, pulse, and                            oxygen saturations were monitored continuously. The                            PCF-HQ190L Colonoscope was introduced through the                            anus and  advanced to the the cecum, identified by                            appendiceal orifice and ileocecal valve. The                            colonoscopy was somewhat difficult due to a                            tortuous colon. Successful completion of the                            procedure was aided by applying abdominal pressure.                            The patient tolerated the procedure well. The                            quality of the bowel preparation was good. D/t                            highly torturous nature of the colon, it was                            somewhat difficult to examine. The ileocecal valve,                            appendiceal orifice, and rectum were photographed. Scope In: 1:49:38 PM Scope Out: 2:14:38 PM Scope Withdrawal Time: 0 hours 16 minutes 51 seconds  Total Procedure Duration: 0 hours 25 minutes 0 seconds  Findings:                 Multiple medium-mouthed diverticula were found in                            the sigmoid colon, descending colon, transverse                            colon, ascending colon and cecum.  Non-bleeding internal hemorrhoids were found during                            retroflexion. The hemorrhoids were small and Grade                            I (internal hemorrhoids that do not prolapse).                           The exam was otherwise without abnormality on                            direct and retroflexion views. The colon was highly                            tortuous and redundant. Complications:            No immediate complications. Estimated Blood Loss:     Estimated blood loss: none. Impression:               - Pancolonic diverticulosis predominantly in the                            sigmoid colon (mod- severe)                           - Non-bleeding internal hemorrhoids.                           - The examination was otherwise normal on direct                            and  retroflexion views.                           - No specimens collected. Recommendation:           - Patient has a contact number available for                            emergencies. The signs and symptoms of potential                            delayed complications were discussed with the                            patient. Return to normal activities tomorrow.                            Written discharge instructions were provided to the                            patient.                           - Resume previous diet.                           -  Miralax 1 capful (17 grams) in 8 ounces of water                            PO daily.                           - Benefiber 1 TBS p.o. every morning with 8 ounces                            of water.                           - Repeat colonoscopy is not recommended for                            screening purposes.                           - The findings and recommendations were discussed                            with the patient's family. Jackquline Denmark, MD 10/24/2021 2:20:24 PM This report has been signed electronically.

## 2021-10-24 NOTE — Progress Notes (Signed)
09/12/2021 Donna Pugh [id number] May 09, 1951     Chief Complaint: Schedule colonoscopy   History of Present Illness: Donna Pugh is a 71 year old female with a past medical history of osteoarthritis, asthma, hyperlipidemia, IBS, chronic constipation and sigmoid diverticulitis 06/2021. She developed lower abdominal pain and she was prescribed Cipro and Flagyl  per her PCP.  Approximately one week later, she underwent a CTAP 06/21/2021 at [facility] as an outpatient showed diverticulosis without evidence of diverticulitis. Her lower abdominal pain abated several days after she took the antibiotics.     She was last seen by Dr. Lyndel Safe on 07/22/2021 for diverticulitis follow up. At that time, a colonoscopy with a 2 day bowel prep to be scheduled in May 2023 was recommended.  Her most recent colonoscopy was 06/23/2011 which identified mild pancolonic diverticulosis and small internal hemorrhoids.  The bowel prep was noted as fair. She presents today to schedule a colonoscopy.    She denies having any further significant abdominal pain. She is taking MiraLAX once or twice daily which results in passing a soft and mushy stool, no diarrhea.  As long as she passes a soft stool, she does not experience any lower abdominal pain.  If she passes a hard stool she develops lower abdominal pain.  Infrequent rectal bleeding. She reported passing a solid marble like lax stools for 2 to 3 days prior to her diagnosis of diverticulitis, the black stools occurred prior to taking the antibiotics as prescribed by her PCP.  No Pepto-Bismol or iron use.  No further black stools.  She denies having any dysphagia or heartburn.  She reports having a history of a hiatal hernia which was possibly identified by prior image studies.  She denies ever having an EGD.       CTAP with contrast 06/21/2021 at [facility]: Colonic diverticulosis, without radiographic evidence of diverticulitis or other acute  findings. Small hiatal hernia. Aortic Atherosclerosis    Colonoscopy 06/23/2011 ([facility] with some difficulty assisted by abdominal pressure): Fair prep -Mild pancolonic diverticulosis predominantly in the sigmoid colon. -Small internal hemorrhoids -Repeat in 5 years -Had hard time passing gas after colon requiring x-ray KUB         Current Outpatient Medications on File Prior to Visit  Medication Sig Dispense Refill   albuterol (VENTOLIN HFA) 108 (90 Base) MCG/ACT inhaler Inhale 2 puffs into the lungs as needed.       aspirin EC 81 MG tablet Take 81 mg by mouth daily. Swallow whole.       bifidobacterium infantis (ALIGN) capsule Take 1 capsule by mouth daily.       budesonide-formoterol (SYMBICORT) 160-4.5 MCG/ACT inhaler SMARTSIG:2 Puff(s) By Mouth Twice Daily       Cholecalciferol (VITAMIN D) 50 MCG (2000 UT) tablet Take 2,000 Units by mouth daily.       dicyclomine (BENTYL) 10 MG capsule Take 10 mg by mouth every 6 (six) hours as needed.       DULoxetine (CYMBALTA) 60 MG capsule Take 60 mg by mouth daily.       fexofenadine (ALLEGRA) 180 MG tablet Take 1 tablet by mouth daily.       meloxicam (MOBIC) 15 MG tablet Take 15 mg by mouth daily as needed.       omeprazole (PRILOSEC) 40 MG capsule Take 40 mg by mouth daily.       polyethylene glycol powder (GLYCOLAX/MIRALAX) 17 GM/SCOOP powder Take 17-34 g by mouth daily.  psyllium (REGULOID) 0.52 g capsule Take 0.52 g by mouth daily.       QUEtiapine (SEROQUEL) 25 MG tablet Take 25 mg by mouth daily.       simvastatin (ZOCOR) 40 MG tablet Take 40 mg by mouth at bedtime.       Vibegron (GEMTESA) 75 MG TABS Take 1 tablet by mouth daily.        No current facility-administered medications on file prior to visit.    No Known drug allergies   Current Medications, Allergies, Past Medical History, Past Surgical History, Family History and Social History were reviewed in [employer] record.     Review of Systems:    Constitutional: Negative for fever, sweats, chills or weight loss.  Respiratory: Negative for shortness of breath.   Cardiovascular: Negative for chest pain, palpitations and leg swelling.  Gastrointestinal: See HPI.  Musculoskeletal: Negative for back pain or muscle aches.  Neurological: Negative for dizziness, headaches or paresthesias.      Physical Exam: BP 102/60   Pulse 66   Ht '5\' 4"'$  (1.626 m) Comment: height measured without shoes  Wt 144 lb 6 oz (65.5 kg)   BMI 24.78 kg/m    General: Pleasant 70 year old female in no acute distress. Head: Normocephalic and atraumatic. Eyes: No scleral icterus. Conjunctiva pink . Ears: Normal auditory acuity. Mouth: Dentition intact. No ulcers or lesions.  Lungs: Clear throughout to auscultation. Heart: Regular rate and rhythm, no murmur. Abdomen: Soft, nontender and nondistended. No masses or hepatomegaly. Normal bowel sounds x 4 quadrants.  Rectal: Deferred.  Musculoskeletal: Symmetrical with no gross deformities. Extremities: No edema. Neurological: Alert oriented x 4. No focal deficits.  Psychological: Alert and cooperative. Normal mood and affect   Assessment and Recommendations:   3) 71 year old female with a history of sigmoid diverticulitis early Feb. 2023 treated with Cipro and Flagyl -> CTAP 06/21/2021 (done after antibiotics were initiated) showed diverticulosis without evidence of diverticulitis. Colonoscopy 2013 showed mild pandiverticulosis, fair bowel prep. Patient noted having difficulty passing gas s/p colonoscopy, KUB was negative.  -Colonoscopy with a 2 day bowel prep benefits and risks discussed including risk with sedation, risk of bleeding, perforation and infection  -Patient to contact office if abdominal pain recurs  -Request most recent CBC and CMP from PCPs office   2) Black stools x 2 to 3 days. Reported history of a hiatal hernia.  She infrequently takes Meloxicam. -EGD at time of colonoscopy. EGD benefits and  risks discussed including risk with sedation, risk of bleeding, perforation and infection. Dr. Lyndel Safe to verify if EGD warranted at time of colonoscopy.  -Patient to contact office if black stools recur   3) Constipation -Continue Miralax once or twice daily as needed        ADDENDUM: Dr. Lyndel Safe verified EGD to be done at time of colonoscopy.

## 2021-10-24 NOTE — Progress Notes (Signed)
Called to room to assist during endoscopic procedure.  Patient ID and intended procedure confirmed with present staff. Received instructions for my participation in the procedure from the performing physician.  

## 2021-10-24 NOTE — Progress Notes (Signed)
Vitals-CW  Pt's states no medical or surgical changes since previsit or office visit. 

## 2021-10-24 NOTE — Progress Notes (Signed)
Pt awake, report to RN, VVS  °

## 2021-10-24 NOTE — Patient Instructions (Signed)
Handout on hiatal hernia, and gastritis provided   Await pathology results.   Continue current medications. Miralax 1 capful (17 grams) in 8 ounces of water PO daily.  Benefiber 1 TBS p.o. every morning with 8 ounces of water. (Can buy both Over the counter)  No repeat colonoscopy recommended for screening purposes   YOU HAD AN ENDOSCOPIC PROCEDURE TODAY AT [place]:   Refer to the procedure report that was given to you for any specific questions about what was found during the examination.  If the procedure report does not answer your questions, please call your gastroenterologist to clarify.  If you requested that your care partner not be given the details of your procedure findings, then the procedure report has been included in a sealed envelope for you to review at your convenience later.  YOU SHOULD EXPECT: Some feelings of bloating in the abdomen. Passage of more gas than usual.  Walking can help get rid of the air that was put into your GI tract during the procedure and reduce the bloating. If you had a lower endoscopy (such as a colonoscopy or flexible sigmoidoscopy) you may notice spotting of blood in your stool or on the toilet paper. If you underwent a bowel prep for your procedure, you may not have a normal bowel movement for a few days.  Please Note:  You might notice some irritation and congestion in your nose or some drainage.  This is from the oxygen used during your procedure.  There is no need for concern and it should clear up in a day or so.  SYMPTOMS TO REPORT IMMEDIATELY:  Following lower endoscopy (colonoscopy or flexible sigmoidoscopy):  Excessive amounts of blood in the stool  Significant tenderness or worsening of abdominal pains  Swelling of the abdomen that is new, acute  Fever of 100F or higher  Following upper endoscopy (EGD)  Vomiting of blood or coffee ground material  New chest pain or pain under the shoulder blades  Painful or  persistently difficult swallowing  New shortness of breath  Fever of 100F or higher  Black, tarry-looking stools  For urgent or emergent issues, a gastroenterologist can be reached at any hour by calling [phone]. Do not use MyChart messaging for urgent concerns.    DIET:  We do recommend a small meal at first, but then you may proceed to your regular diet.  Drink plenty of fluids but you should avoid alcoholic beverages for 24 hours.  ACTIVITY:  You should plan to take it easy for the rest of today and you should NOT DRIVE or use heavy machinery until tomorrow (because of the sedation medicines used during the test).    FOLLOW UP: Our staff will call the number listed on your records 24-72 hours following your procedure to check on you and address any questions or concerns that you may have regarding the information given to you following your procedure. If we do not reach you, we will leave a message.  We will attempt to reach you two times.  During this call, we will ask if you have developed any symptoms of COVID 19. If you develop any symptoms (ie: fever, flu-like symptoms, shortness of breath, cough etc.) before then, please call [phone].  If you test positive for Covid 19 in the 2 weeks post procedure, please call and report this information to [place].    If any biopsies were taken you will be contacted by phone or by letter within the  next 1-3 weeks.  Please call us at [phone] if you have not heard about the biopsies in 3 weeks.    SIGNATURES/CONFIDENTIALITY: You and/or your care partner have signed paperwork which will be entered into your electronic medical record.  These signatures attest to the fact that that the information above on your After Visit Summary has been reviewed and is understood.  Full responsibility of the confidentiality of this discharge information lies with you and/or your care-partner.

## 2021-10-25 ENCOUNTER — Telehealth: Payer: Self-pay

## 2021-10-25 NOTE — Telephone Encounter (Signed)
  Follow up Call-     10/24/2021   12:28 PM 10/24/2021   12:21 PM  Call back number  Post procedure Call Back phone  # [phone]   Permission to leave phone message  Yes     Patient questions:  Do you have a fever, pain , or abdominal swelling? No. Pain Score  0 *  Have you tolerated food without any problems? Yes.    Have you been able to return to your normal activities? Yes.    Do you have any questions about your discharge instructions: Diet   No. Medications  No. Follow up visit  No.  Do you have questions or concerns about your Care? No.  Actions: * If pain score is 4 or above: No action needed, pain <4.

## 2021-10-29 ENCOUNTER — Encounter: Payer: Self-pay | Admitting: Gastroenterology

## 2021-11-09 DIAGNOSIS — M1712 Unilateral primary osteoarthritis, left knee: Secondary | ICD-10-CM | POA: Diagnosis not present

## 2021-11-09 DIAGNOSIS — M84352A Stress fracture, left femur, initial encounter for fracture: Secondary | ICD-10-CM | POA: Diagnosis not present

## 2021-11-11 DIAGNOSIS — M25562 Pain in left knee: Secondary | ICD-10-CM | POA: Diagnosis not present

## 2021-11-12 DIAGNOSIS — J45909 Unspecified asthma, uncomplicated: Secondary | ICD-10-CM | POA: Diagnosis not present

## 2021-11-12 DIAGNOSIS — E785 Hyperlipidemia, unspecified: Secondary | ICD-10-CM | POA: Diagnosis not present

## 2021-11-16 DIAGNOSIS — M1712 Unilateral primary osteoarthritis, left knee: Secondary | ICD-10-CM | POA: Diagnosis not present

## 2021-11-22 DIAGNOSIS — M7052 Other bursitis of knee, left knee: Secondary | ICD-10-CM | POA: Diagnosis not present

## 2021-11-22 DIAGNOSIS — M1712 Unilateral primary osteoarthritis, left knee: Secondary | ICD-10-CM | POA: Diagnosis not present

## 2021-12-13 DIAGNOSIS — E785 Hyperlipidemia, unspecified: Secondary | ICD-10-CM | POA: Diagnosis not present

## 2021-12-13 DIAGNOSIS — J45909 Unspecified asthma, uncomplicated: Secondary | ICD-10-CM | POA: Diagnosis not present

## 2022-01-03 DIAGNOSIS — J020 Streptococcal pharyngitis: Secondary | ICD-10-CM | POA: Diagnosis not present

## 2022-01-03 DIAGNOSIS — R059 Cough, unspecified: Secondary | ICD-10-CM | POA: Diagnosis not present

## 2022-01-03 DIAGNOSIS — Z20822 Contact with and (suspected) exposure to covid-19: Secondary | ICD-10-CM | POA: Diagnosis not present

## 2022-01-03 DIAGNOSIS — Z6823 Body mass index (BMI) 23.0-23.9, adult: Secondary | ICD-10-CM | POA: Diagnosis not present

## 2022-01-13 DIAGNOSIS — E785 Hyperlipidemia, unspecified: Secondary | ICD-10-CM | POA: Diagnosis not present

## 2022-01-13 DIAGNOSIS — J45909 Unspecified asthma, uncomplicated: Secondary | ICD-10-CM | POA: Diagnosis not present

## 2022-01-30 DIAGNOSIS — R7303 Prediabetes: Secondary | ICD-10-CM | POA: Diagnosis not present

## 2022-01-30 DIAGNOSIS — E785 Hyperlipidemia, unspecified: Secondary | ICD-10-CM | POA: Diagnosis not present

## 2022-02-06 DIAGNOSIS — J320 Chronic maxillary sinusitis: Secondary | ICD-10-CM | POA: Diagnosis not present

## 2022-02-06 DIAGNOSIS — Z6822 Body mass index (BMI) 22.0-22.9, adult: Secondary | ICD-10-CM | POA: Diagnosis not present

## 2022-02-06 DIAGNOSIS — E785 Hyperlipidemia, unspecified: Secondary | ICD-10-CM | POA: Diagnosis not present

## 2022-02-06 DIAGNOSIS — R7303 Prediabetes: Secondary | ICD-10-CM | POA: Diagnosis not present

## 2022-02-12 DIAGNOSIS — J45909 Unspecified asthma, uncomplicated: Secondary | ICD-10-CM | POA: Diagnosis not present

## 2022-02-12 DIAGNOSIS — E785 Hyperlipidemia, unspecified: Secondary | ICD-10-CM | POA: Diagnosis not present

## 2022-02-14 DIAGNOSIS — Z23 Encounter for immunization: Secondary | ICD-10-CM | POA: Diagnosis not present

## 2022-03-02 ENCOUNTER — Other Ambulatory Visit: Payer: Self-pay | Admitting: Family Medicine

## 2022-03-02 DIAGNOSIS — Z1231 Encounter for screening mammogram for malignant neoplasm of breast: Secondary | ICD-10-CM

## 2022-03-02 DIAGNOSIS — E2839 Other primary ovarian failure: Secondary | ICD-10-CM | POA: Diagnosis not present

## 2022-03-06 DIAGNOSIS — Z1339 Encounter for screening examination for other mental health and behavioral disorders: Secondary | ICD-10-CM | POA: Diagnosis not present

## 2022-03-06 DIAGNOSIS — Z0000 Encounter for general adult medical examination without abnormal findings: Secondary | ICD-10-CM | POA: Diagnosis not present

## 2022-03-06 DIAGNOSIS — Z139 Encounter for screening, unspecified: Secondary | ICD-10-CM | POA: Diagnosis not present

## 2022-03-06 DIAGNOSIS — Z136 Encounter for screening for cardiovascular disorders: Secondary | ICD-10-CM | POA: Diagnosis not present

## 2022-03-06 DIAGNOSIS — Z23 Encounter for immunization: Secondary | ICD-10-CM | POA: Diagnosis not present

## 2022-03-06 DIAGNOSIS — Z1331 Encounter for screening for depression: Secondary | ICD-10-CM | POA: Diagnosis not present

## 2022-03-15 DIAGNOSIS — E785 Hyperlipidemia, unspecified: Secondary | ICD-10-CM | POA: Diagnosis not present

## 2022-03-15 DIAGNOSIS — J45909 Unspecified asthma, uncomplicated: Secondary | ICD-10-CM | POA: Diagnosis not present

## 2022-03-31 DIAGNOSIS — N390 Urinary tract infection, site not specified: Secondary | ICD-10-CM | POA: Diagnosis not present

## 2022-03-31 DIAGNOSIS — Z6822 Body mass index (BMI) 22.0-22.9, adult: Secondary | ICD-10-CM | POA: Diagnosis not present

## 2022-03-31 DIAGNOSIS — R300 Dysuria: Secondary | ICD-10-CM | POA: Diagnosis not present

## 2022-04-14 DIAGNOSIS — J45909 Unspecified asthma, uncomplicated: Secondary | ICD-10-CM | POA: Diagnosis not present

## 2022-04-14 DIAGNOSIS — E785 Hyperlipidemia, unspecified: Secondary | ICD-10-CM | POA: Diagnosis not present

## 2022-05-01 ENCOUNTER — Ambulatory Visit
Admission: RE | Admit: 2022-05-01 | Discharge: 2022-05-01 | Disposition: A | Discharge status: Home / Self Care | Payer: Medicare Other | Source: Ambulatory Visit | Attending: Family Medicine | Admitting: Family Medicine

## 2022-05-01 DIAGNOSIS — Z1231 Encounter for screening mammogram for malignant neoplasm of breast: Secondary | ICD-10-CM

## 2023-03-16 HISTORY — PX: FOOT SURGERY: SHX648

## 2023-05-21 ENCOUNTER — Other Ambulatory Visit: Payer: Self-pay | Admitting: Family Medicine

## 2023-05-21 DIAGNOSIS — Z1231 Encounter for screening mammogram for malignant neoplasm of breast: Secondary | ICD-10-CM

## 2023-05-23 ENCOUNTER — Inpatient Hospital Stay: Admission: RE | Admit: 2023-05-23 | Payer: Medicare Other | Source: Ambulatory Visit

## 2023-09-13 ENCOUNTER — Encounter: Payer: Self-pay | Admitting: Allergy and Immunology

## 2023-09-13 ENCOUNTER — Ambulatory Visit (INDEPENDENT_AMBULATORY_CARE_PROVIDER_SITE_OTHER): Payer: No Typology Code available for payment source | Admitting: Allergy and Immunology

## 2023-09-13 VITALS — BP 126/66 | HR 80 | Resp 16 | Ht 64.5 in | Wt 146.4 lb

## 2023-09-13 DIAGNOSIS — J4540 Moderate persistent asthma, uncomplicated: Secondary | ICD-10-CM

## 2023-09-13 DIAGNOSIS — J3089 Other allergic rhinitis: Secondary | ICD-10-CM

## 2023-09-13 DIAGNOSIS — K219 Gastro-esophageal reflux disease without esophagitis: Secondary | ICD-10-CM | POA: Diagnosis not present

## 2023-09-13 DIAGNOSIS — J301 Allergic rhinitis due to pollen: Secondary | ICD-10-CM | POA: Diagnosis not present

## 2023-09-13 MED ORDER — AIRSUPRA 90-80 MCG/ACT IN AERO: 2.0000 | INHALATION_SPRAY | RESPIRATORY_TRACT | 1 refills | Status: AC | PRN

## 2023-09-13 MED ORDER — OMEPRAZOLE 40 MG PO CPDR: 40.0000 mg | DELAYED_RELEASE_CAPSULE | Freq: Two times a day (BID) | ORAL | 5 refills | Status: AC

## 2023-09-13 MED ORDER — SPACER/AERO-HOLDING CHAMBERS DEVI: 1 refills | Status: AC

## 2023-09-13 MED ORDER — RYALTRIS 665-25 MCG/ACT NA SUSP: NASAL | 5 refills | Status: AC

## 2023-09-13 NOTE — Patient Instructions (Addendum)
  1. Return for skin testing. No antihistamines  2. Treat and prevent inflammation:   A. Symbicort 160 - 2 inhalations 2 times per day w/ spacer (empty lungs)  B. Ryaltris  - 2 sprays each nostril 1-2 times per day  3. Treat and prevent reflux induced inflammation:   A. Minimize caffeine and chocolate consumption  B. Replace throat clearing with swallowing / drinking maneuver  C. Increase omeprazole  40 mg - 2 times per day  4. If needed:   A. Airsupra  - 2 inhalations every 6 hours (replaces albuterol )  B. Ipratropium 0.06% - 2 sprays each nostril every 6 hours to dry nose  5. Influenza = Tamiflu. Covid = Paxlovid

## 2023-09-13 NOTE — Progress Notes (Unsigned)
 [place] - [place]   Dear Arlena Lacrosse,  Thank you for referring Donna Pugh to the [facility] [place]  on 09/13/2023.   Below is a summation of this patient's evaluation and recommendations.  Thank you for your referral. I will keep you informed about this patient's response to treatment.   If you have any questions please do not hesitate to contact me.   Sincerely,  Fabienne Holter, MD Allergy / Immunology [facility] [place]    ______________________________________________________________________    NEW PATIENT NOTE  Referring Provider: Lonie Roa, MD Primary Provider: Lonie Roa, MD Date of office visit: 09/13/2023    Subjective:   Chief Complaint:  Donna Pugh (DOB: [birth year]) is a 73 y.o. female who presents to the clinic on 09/13/2023 with a chief complaint of Allergic Rhinitis  and Asthma .     HPI: Vanely presents to this clinic in evaluation of respiratory tract issues.  For at least the past several decade she has been having problems with "allergies" and "asthma".  She has nasal congestion and sneezing and itchy watery eyes and she has coughing.  She has coughing spells associated with gagging.  Her coughing spells also have a raspy voice.  She has constant throat clearing and feelings of something is "caught in her throat".  She believes that her symptoms occur on a perennial basis and flare during the spring and fall.  She has apparently been skin tested 2018 and started a course of immunotherapy for 1 year directed against seasonal allergens which apparently helped her somewhat.  When she uses a short acting bronchodilator which she does relatively infrequently averaging out about once a week it may help her cough.  She will use the short acting bronchodilator prior to exercise which may help her cough.  But no other medicines have really helped any  of her other symptoms.  She has reflux disease with regurgitation.  She is using omeprazole  once a day which she thinks works great.  She consumes 2 cups of coffee in the morning, a Coke 0 later in the day, chocolate on a daily basis, and a rare tea.  She does not really have much of a infectious disease history other than the fact that she contracted COVID in January 2025 and she had "walking pneumonia" in February 2025.  She had a chest x-ray in March 2025.  Past Medical History:  Diagnosis Date   Acid reflux disease    Asthma    Depression    History of diverticulitis    Hypercholesterolemia    IBS (irritable bowel syndrome)    OA (osteoarthritis)    Overactive bladder     Past Surgical History:  Procedure Laterality Date   APPENDECTOMY     BLADDER SURGERY  2014   vagina and rectal tacked   CHOLECYSTECTOMY     COLONOSCOPY  06/23/2011   [place] colonic diverticulosis (predominantly in the sigmoid colon) Small internal hemorrhoids   FOOT SURGERY Left 03/2023   NASAL SINUS SURGERY     x2 2007 and 2010   TONSILLECTOMY     VAGINAL HYSTERECTOMY      Allergies as of 09/13/2023   No Known Allergies      Medication List    albuterol  108 (90 Base) MCG/ACT inhaler Commonly known as: VENTOLIN  HFA Inhale 2 puffs into the lungs as needed.   aspirin EC 81 MG  tablet Take 81 mg by mouth daily. Swallow whole.   budesonide-formoterol 160-4.5 MCG/ACT inhaler Commonly known as: SYMBICORT SMARTSIG:2 Puff(s) By Mouth Twice Daily   DULoxetine 60 MG capsule Commonly known as: CYMBALTA Take 60 mg by mouth daily.   ipratropium 0.06 % nasal spray Commonly known as: ATROVENT Place into both nostrils.   omeprazole  40 MG capsule Commonly known as: PRILOSEC Take 40 mg by mouth daily.   QUEtiapine 25 MG tablet Commonly known as: SEROQUEL Take 25 mg by mouth daily.   simvastatin 40 MG tablet Commonly known as: ZOCOR Take 40 mg by mouth at bedtime.    Review of systems  negative except as noted in HPI / PMHx or noted below:  Review of Systems  Constitutional: Negative.   HENT: Negative.    Eyes: Negative.   Respiratory: Negative.    Cardiovascular: Negative.   Gastrointestinal: Negative.   Genitourinary: Negative.   Musculoskeletal: Negative.   Skin: Negative.   Neurological: Negative.   Endo/Heme/Allergies: Negative.   Psychiatric/Behavioral: Negative.      Family History  Problem Relation Age of Onset   Heart disease Mother    Liver disease Mother    Heart disease Father    Diabetes Father    Diabetes Sister    Diabetes Brother    Diabetes Nephew    Diabetes Nephew    Breast cancer Neg Hx    Colon cancer Neg Hx    Rectal cancer Neg Hx    Stomach cancer Neg Hx     Social History   Socioeconomic History   Marital status: Married    Spouse name: 2   Number of children: Not on file   Years of education: Not on file   Highest education level: Not on file  Occupational History   Occupation: Retired  Tobacco Use   Smoking status: Never   Smokeless tobacco: Never  Vaping Use   Vaping status: Never Used  Substance and Sexual Activity   Alcohol  use: Yes    Comment: occ   Drug use: Never   Sexual activity: Not on file  Other Topics Concern   Not on file  Social History Narrative   Not on file   Social Drivers of Health   Financial Resource Strain: Not on file  Food Insecurity: Low Risk  (07/26/2023)   Received from [facility]   Hunger Vital Sign    Worried About Running Out of Food in the Last Year: Never true    Ran Out of Food in the Last Year: Never true  Transportation Needs: No Transportation Needs (07/26/2023)   Received from [facility]    In the past 12 months, has lack of reliable transportation kept you from medical appointments, meetings, work or from getting things needed for daily living? : No  Physical Activity: Not on file  Stress: Not on file  Social Connections: Not on file   Intimate Partner Violence: Not on file    Environmental and Social history  Lives in a house with a dry environment, dog located inside the household for the past 7 years, no carpeting in the bedroom, plastic on the bed, no plastic on the pillow, no smoking ongoing with inside the household.  Objective:   Vitals:   09/13/23 0903  BP: 126/66  Pulse: 80  Resp: 16  SpO2: 95%   Height: 5' 4.5" (163.8 cm) Weight: 146 lb 6.4 oz (66.4 kg)  Physical Exam Constitutional:  Appearance: She is not diaphoretic.     Comments: Constant throat clearing  HENT:     Head: Normocephalic.     Right Ear: Tympanic membrane, ear canal and external ear normal.     Left Ear: Tympanic membrane, ear canal and external ear normal.     Nose: Nose normal. No mucosal edema or rhinorrhea.     Mouth/Throat:     Pharynx: Uvula midline. No oropharyngeal exudate.  Eyes:     Conjunctiva/sclera: Conjunctivae normal.  Neck:     Thyroid: No thyromegaly.     Trachea: Trachea normal. No tracheal tenderness or tracheal deviation.  Cardiovascular:     Rate and Rhythm: Normal rate and regular rhythm.     Heart sounds: Normal heart sounds, S1 normal and S2 normal. No murmur heard. Pulmonary:     Effort: No respiratory distress.     Breath sounds: Normal breath sounds. No stridor. No wheezing or rales.  Lymphadenopathy:     Head:     Right side of head: No tonsillar adenopathy.     Left side of head: No tonsillar adenopathy.     Cervical: No cervical adenopathy.  Skin:    Findings: No erythema or rash.     Nails: There is no clubbing.  Neurological:     Mental Status: She is alert.     Diagnostics: Allergy skin tests were performed.   Spirometry was performed and demonstrated an FEV1 of 1.56 @ 72 % of predicted. FEV1/FVC = 0.79  The patient had an Asthma Control Test with the following results:  .     Assessment and Plan:    No diagnosis found.  Patient Instructions   1. Return for skin  testing. No antihistamines  2. Treat and prevent inflammation:   A. Symbicort 160 - 2 inhalations 2 times per day w/ spacer (empty lungs)  B. Ryaltris  - 2 sprays each nostril 1-2 times per day  3. Treat and prevent reflux induced inflammation:   A. Minimize caffeine and chocolate consumption  B. Replace throat clearing with swallowing / drinking maneuver  C. Increase omeprazole  40 mg - 2 times per day  4. If needed:   A. Airsupra  - 2 inhalations every 6 hours (replaces albuterol )  B. Ipratropium 0.06% - 2 sprays each nostril every 6 hours to dry nose  5. Influenza = Tamiflu. Covid = Paxlovid   Fabienne Holter, MD Allergy / Immunology [facility] [place] 

## 2023-09-17 ENCOUNTER — Encounter: Payer: Self-pay | Admitting: Allergy and Immunology

## 2023-09-26 ENCOUNTER — Ambulatory Visit (INDEPENDENT_AMBULATORY_CARE_PROVIDER_SITE_OTHER): Admitting: Allergy and Immunology

## 2023-09-26 DIAGNOSIS — J4540 Moderate persistent asthma, uncomplicated: Secondary | ICD-10-CM

## 2023-09-27 ENCOUNTER — Encounter: Payer: Self-pay | Admitting: Allergy and Immunology

## 2023-09-27 NOTE — Progress Notes (Signed)
 Donna Pugh returns to this clinic for skin testing.  Allergy skin testing did not identify any hypersensitivity against a screening panel of aeroallergens.

## 2023-10-15 IMAGING — MG MM DIGITAL SCREENING BILAT W/ TOMO AND CAD
8 series · 8 of 24 positions shown · non-contrast
Comparison: Previous exam(s).

CLINICAL DATA: Screening.

EXAM:
DIGITAL SCREENING BILATERAL MAMMOGRAM WITH TOMOSYNTHESIS AND CAD
TECHNIQUE: Bilateral screening digital craniocaudal and mediolateral oblique
mammograms were obtained. Bilateral screening digital breast
tomosynthesis was performed. The images were evaluated with
computer-aided detection.

[R MLO synth-2D]
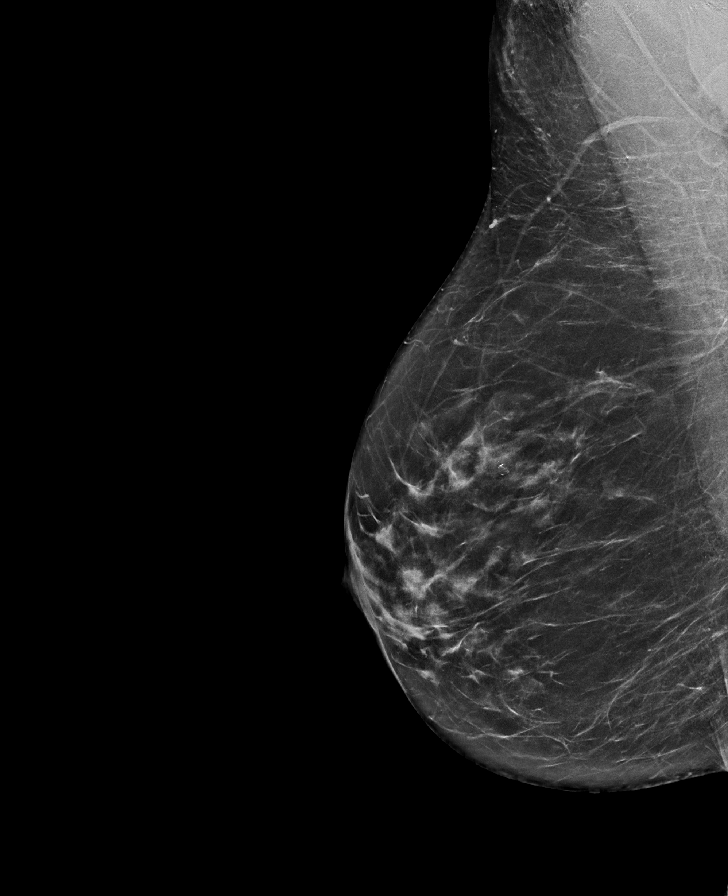

[R CC synth-2D]
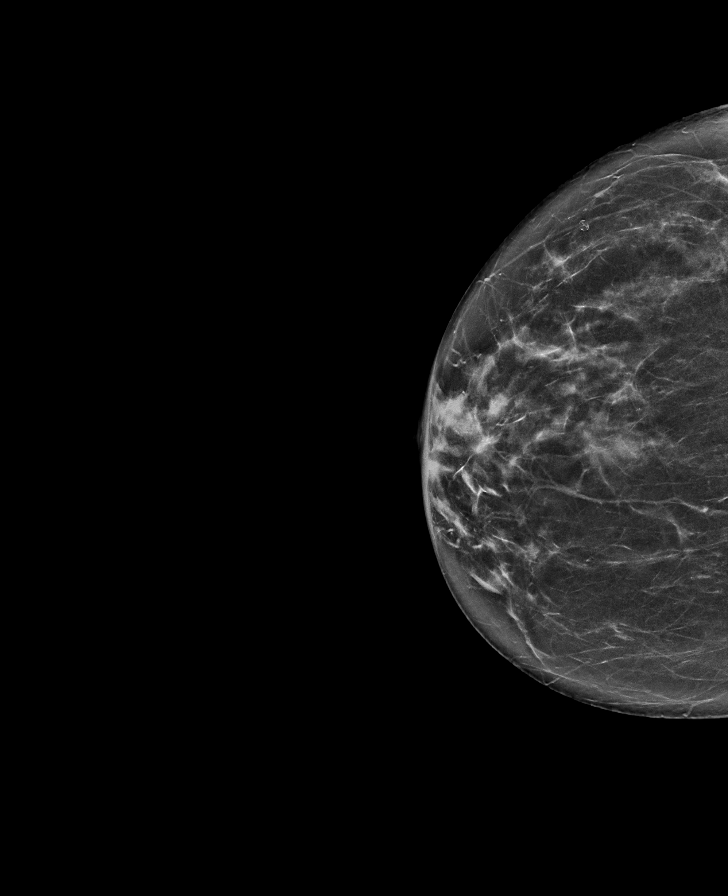

[L CC synth-2D]
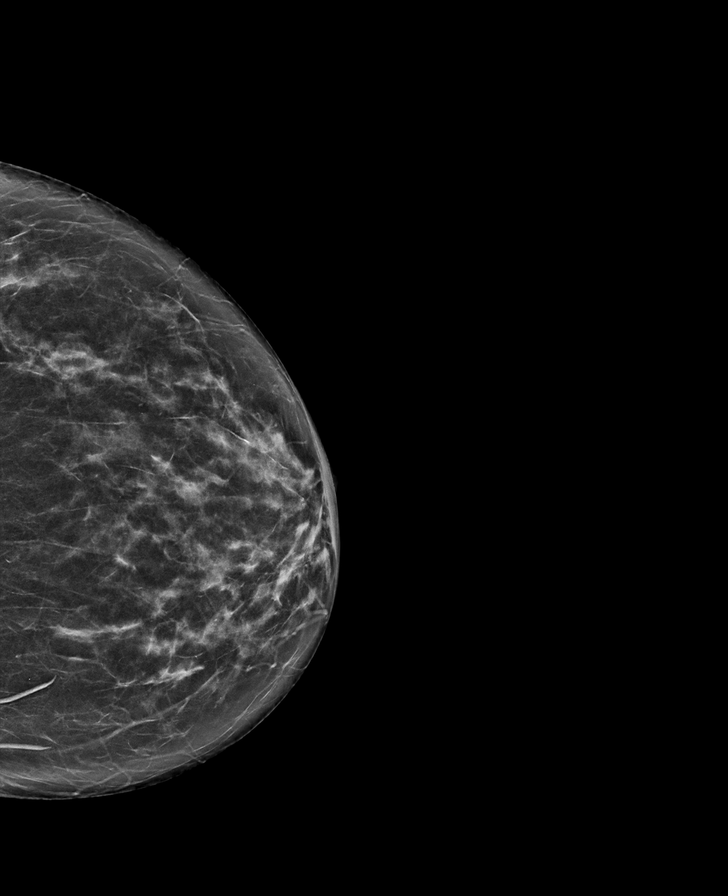

[L MLO synth-2D]
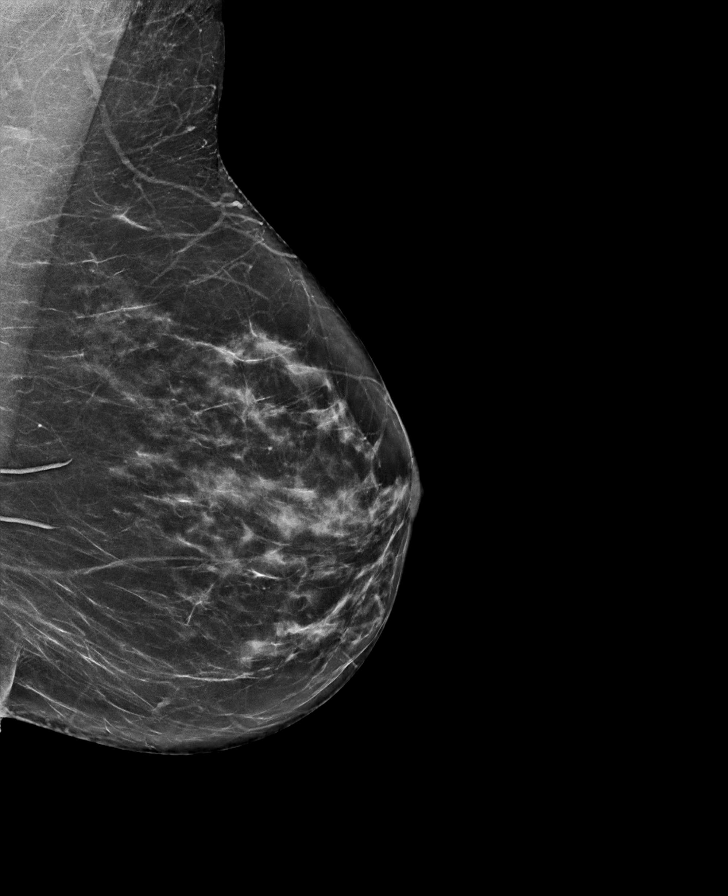

[L MLO tomo · tomo slice 35/70.0]
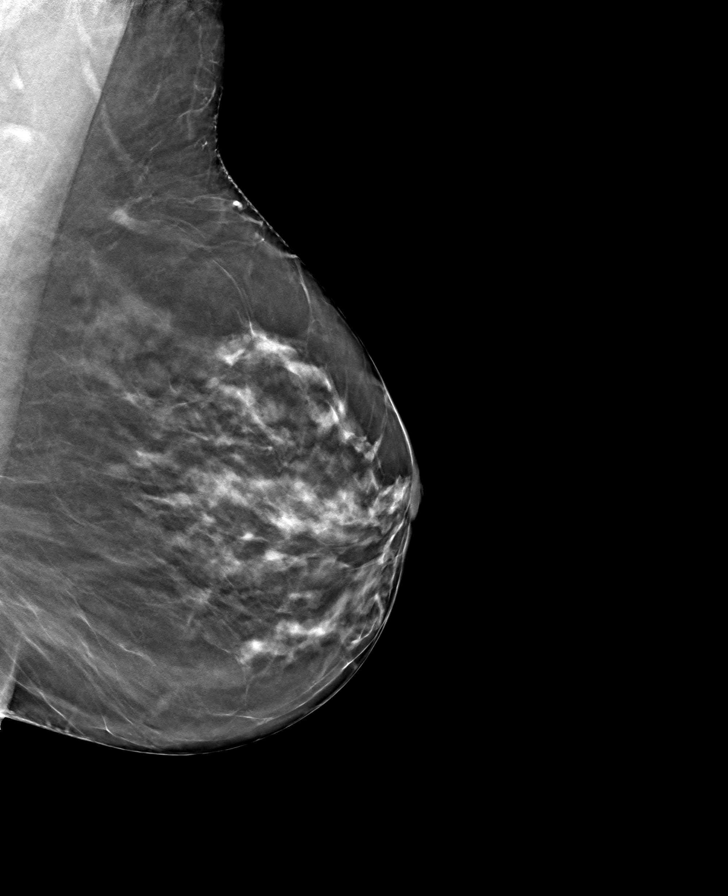

[R MLO tomo · tomo slice 37/74.0]
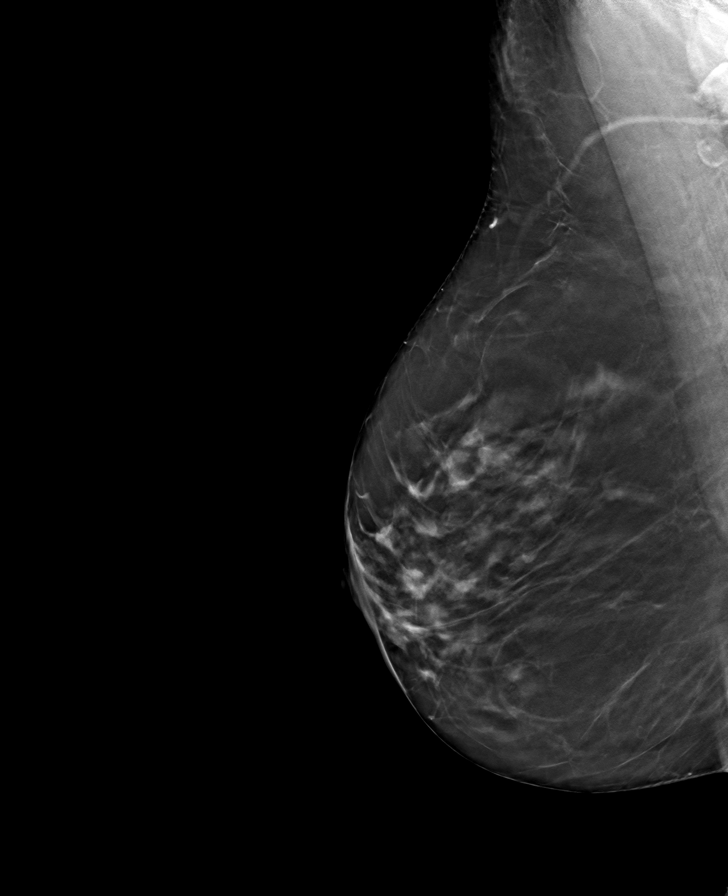

[L CC tomo · tomo slice 35/68.0]
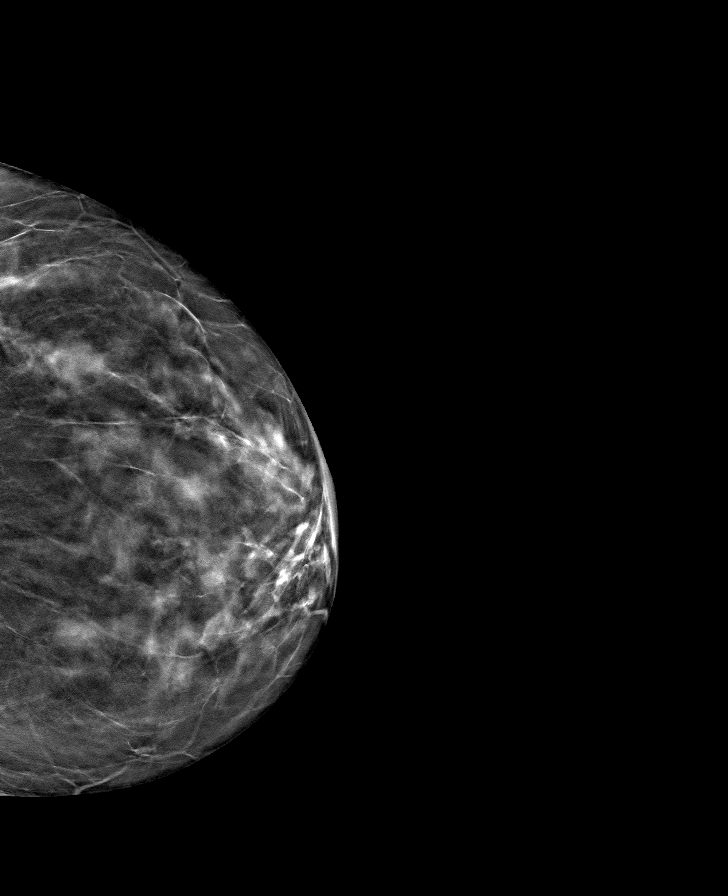

[R CC tomo · tomo slice 35/69.0]
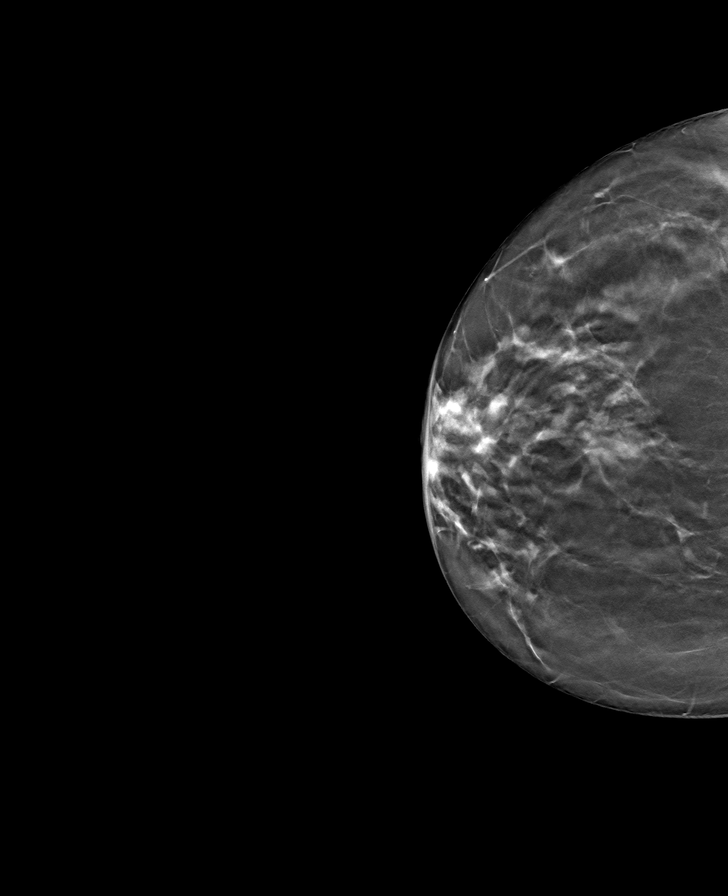

[8 of 24 positions shown; findings below may reference images not displayed]

ACR Breast Density Category c: The breast tissue is heterogeneously
dense, which may obscure small masses.
FINDINGS: There are no findings suspicious for malignancy.
IMPRESSION: No mammographic evidence of malignancy. A result letter of this
screening mammogram will be mailed directly to the patient.

RECOMMENDATION:
Screening mammogram in one year. (Code:Q3-W-BC3)

BI-RADS CATEGORY  1: Negative.

## 2023-11-01 ENCOUNTER — Ambulatory Visit: Admitting: Allergy and Immunology

## 2023-11-21 ENCOUNTER — Ambulatory Visit
Admission: RE | Admit: 2023-11-21 | Discharge: 2023-11-21 | Disposition: A | Discharge status: Home / Self Care | Source: Ambulatory Visit | Attending: Family Medicine | Admitting: Family Medicine

## 2023-11-21 DIAGNOSIS — Z1231 Encounter for screening mammogram for malignant neoplasm of breast: Secondary | ICD-10-CM

## 2024-03-08 ENCOUNTER — Other Ambulatory Visit: Payer: Self-pay | Admitting: Allergy and Immunology

## 2024-05-01 ENCOUNTER — Other Ambulatory Visit (HOSPITAL_BASED_OUTPATIENT_CLINIC_OR_DEPARTMENT_OTHER): Payer: Self-pay | Admitting: Physician Assistant

## 2024-05-01 DIAGNOSIS — R059 Cough, unspecified: Secondary | ICD-10-CM

## 2024-05-02 ENCOUNTER — Ambulatory Visit (INDEPENDENT_AMBULATORY_CARE_PROVIDER_SITE_OTHER)
Admission: RE | Admit: 2024-05-02 | Discharge: 2024-05-02 | Disposition: A | Discharge status: Home / Self Care | Source: Ambulatory Visit | Attending: Physician Assistant | Admitting: Physician Assistant

## 2024-05-02 DIAGNOSIS — R059 Cough, unspecified: Secondary | ICD-10-CM
